# Patient Record
Sex: Male | Born: 1965 | Hispanic: Yes | Marital: Married | Smoking: Former smoker
Health system: Southern US, Community
[De-identification: ages and names within clinical notes are randomized; demographics above are authoritative.]

## PROBLEM LIST (undated history)

## (undated) DIAGNOSIS — I10 Essential (primary) hypertension: Secondary | ICD-10-CM

## (undated) DIAGNOSIS — I639 Cerebral infarction, unspecified: Secondary | ICD-10-CM

## (undated) HISTORY — DX: Essential (primary) hypertension: I10

## (undated) HISTORY — PX: NO PAST SURGERIES: SHX2092

---

## 2010-10-28 ENCOUNTER — Ambulatory Visit: Payer: Self-pay | Admitting: Internal Medicine

## 2015-07-22 ENCOUNTER — Ambulatory Visit
Admission: EM | Admit: 2015-07-22 | Discharge: 2015-07-22 | Disposition: A | Discharge status: Home / Self Care | Payer: Commercial Managed Care - PPO | Attending: Family Medicine | Admitting: Family Medicine

## 2015-07-22 DIAGNOSIS — L237 Allergic contact dermatitis due to plants, except food: Secondary | ICD-10-CM | POA: Diagnosis not present

## 2015-07-22 MED ORDER — LORATADINE 10 MG PO TABS: 10.0000 mg | ORAL_TABLET | Freq: Every day | ORAL | Status: DC

## 2015-07-22 MED ORDER — PREDNISONE 10 MG (21) PO TBPK: ORAL_TABLET | ORAL | Status: DC

## 2015-07-22 NOTE — ED Notes (Signed)
Pt puts the fence and has bushes think got poison ivy rash is on hand neck and chest onset 2 days itching.

## 2015-07-22 NOTE — Discharge Instructions (Signed)
Follow up this week for high blood pressure with PCP, Check 1-2 times & write down to take with you  Hiedra venenosa  (Poison Ivy) Luego de la exposicin previa a la planta. La erupcin suele aparecer 48 horas despus de la exposicin. Suelen ser bultos (ppulas) o ampollas (vesculas) en un patrn lineal. abrirse. Los ojos tambin podran hincharse. Las hinchazn es peor por la maana y mejora a medida que Magazine features editor. Deben tomarse todas las precauciones para prevenir una infeccin bacteriana (por grmenes) secundaria, que puede ocasionar cicatrices. Mantenga todas las reas abiertas secas, limpias y vendadas y cbralas con un ungento antibacteriano, en caso que lo necesite. Si no aparece una infeccin secundaria, esta dermatitis generalmente se [place] 2 o 3 semanas sin tratamiento. INSTRUCCIONES PARA EL CUIDADO DOMICILIARIO Lvese cuidadosamente con agua y jabn tan pronto como ocurra la exposicin al txico. Tiene alrededor de media hora para retirar la resina de la planta antes de que le cause el sarpullido. El lavado destruir rpidamente el aceite o antgeno que se encuentra sobre la piel y que podr causar el sarpullido. Lave enrgicamente debajo de las uas. Todo resto de resina seguir diseminando el sarpullido. No se frote la piel vigorosamente cuando lava la zona afectada. La dermatitis no se extender si retira todo el aceite de la planta que haya quedado en su cuerpo. Un sarpullido que se ha transformado en lesiones que supuran (llagas) no diseminar el sarpullido, a menos que no se haya lavado cuidadosamente. Tambin es importante lavar todas las prendas que [place]. Pueden tener alrgenos [employer]. El sarpullido volver, an varios das ms tarde. La mejor medida es evitar el contacto con la planta en el futuro. La hiedra venenosa puede reconocerse por el nmero de hojas, En general, la hiedra venenosa tiene tres hojas con ramas floridas en un tallo simple. Podr adquirir  difenhidramina que es un medicamento de venta [place], y Pharmacologist segn lo necesite para Barrister's clerk. No conduzca automviles si este medicamento le produce somnolencia. Consulte con el profesional que lo asiste acerca de los medicamentos que podr administrarle a los nios. SOLICITE ATENCIN MDICA SI:  Observa reas abiertas.  Enrojecimiento que se extiende ms all de la zona del sarpullido.  Una secrecin purulenta (similar al pus).  Aumento del dolor.  Desarrolla otros signos de infeccin (como fiebre).   Esta informacin no tiene Marine scientist el consejo del mdico. Asegrese de hacerle al mdico cualquier pregunta que tenga.   Document Released: 12/25/2004 Document Revised: 06/09/2011 Elsevier Interactive Patient Education [employer].

## 2015-07-22 NOTE — ED Provider Notes (Signed)
CSN: [id number]     Arrival date & time 07/22/15  [id number] History   First MD Initiated Contact with Patient 07/22/15 1049     Chief Complaint  Patient presents with  . Rash   HPI  Bohumil Kerkman is a pleasant 50 y.o. male who presents with a rash. He was working at his son's house around the fence trying to cut down bushes and was exposed to poison ivy 2 days ago. Developed swelling of his left eyelid and a scant rash on his chest and right arm. The rash is itchy and red. He has had palpable poison ivy several times before and this is very similar. Denies any shortness of breath.  Denies visual changes or eye discharge.  History reviewed. No pertinent past medical history. History reviewed. No pertinent past surgical history. History reviewed. No pertinent family history. Social History  Substance Use Topics  . Smoking status: Never Smoker   . Smokeless tobacco: None  . Alcohol Use: Yes    Review of Systems  Constitutional: Negative.   HENT: Negative.   Eyes: Negative.   Respiratory: Negative.   Cardiovascular: Negative.   Gastrointestinal: Negative.   Genitourinary: Negative.   Musculoskeletal: Negative.   Skin: Positive for rash.  Allergic/Immunologic: Positive for environmental allergies.  Neurological: Negative.   Hematological: Negative.   Psychiatric/Behavioral: Negative.     Allergies  Influenza a (h1n1) monoval vac  Home Medications   Prior to Admission medications   Medication Sig Start Date End Date Taking? Authorizing Provider  loratadine (CLARITIN) 10 MG tablet Take 1 tablet (10 mg total) by mouth daily. Take 1 tablet in the morning. As needed for itching. 07/22/15   Andria Meuse, NP  predniSONE (STERAPRED UNI-PAK 21 TAB) 10 MG (21) TBPK tablet 6 tabs day 1 and 2, 5 tabs day 3 and 4, 4 tabs day 5 and 6, 3 tabs day 7 and 8, 2 tabs day 9 and 10, 1 tab day 11 and 12. Take orally 07/22/15   Andria Meuse, NP   Meds Ordered and Administered this Visit  Medications  - No data to display  BP 173/110 mmHg  Pulse 61  Temp(Src) 97.9 F (36.6 C) (Oral)  Resp 16  SpO2 100% No data found.   Physical Exam  Constitutional: He is oriented to person, place, and time. He appears well-developed and well-nourished. No distress.  HENT:  Head: Normocephalic and atraumatic.  Eyes: Conjunctivae are normal. No scleral icterus.  Neck: Normal range of motion.  Cardiovascular: Normal rate and regular rhythm.   Pulmonary/Chest: Effort normal and breath sounds normal. No respiratory distress.  Abdominal: Soft. Bowel sounds are normal. He exhibits no distension.  Musculoskeletal: Normal range of motion. He exhibits no edema or tenderness.  Neurological: He is alert and oriented to person, place, and time. No cranial nerve deficit.  Skin: Skin is warm and dry. Rash noted. Rash is vesicular. There is erythema. No cyanosis. Nails show no clubbing.     Psychiatric: His behavior is normal. Judgment normal.  Nursing note and vitals reviewed.   ED Course  Procedures n/a  Labs Review Labs Reviewed - No data to display  Imaging Review No results found.  MDM   1. Contact dermatitis due to poison ivy   With periorbital edema, scant lesions to chest & right arm.   Plan: diagnosis reviewed with patient/daughter Rx as per orders;  benefits, risks, potential side effects reviewed  Recommend supportive treatment with oatmeal baths Seek additional medical  care if symptoms worsen or are not improving     Andria Meuse, NP 07/22/15 1117

## 2016-12-23 ENCOUNTER — Ambulatory Visit: Payer: Self-pay | Admitting: Family Medicine

## 2016-12-25 ENCOUNTER — Ambulatory Visit (INDEPENDENT_AMBULATORY_CARE_PROVIDER_SITE_OTHER): Payer: Commercial Managed Care - PPO | Admitting: Family Medicine

## 2016-12-25 ENCOUNTER — Encounter: Payer: Self-pay | Admitting: Family Medicine

## 2016-12-25 VITALS — BP 160/100 | HR 88 | Ht 66.0 in | Wt 199.0 lb

## 2016-12-25 DIAGNOSIS — Z7689 Persons encountering health services in other specified circumstances: Secondary | ICD-10-CM | POA: Diagnosis not present

## 2016-12-25 DIAGNOSIS — I10 Essential (primary) hypertension: Secondary | ICD-10-CM | POA: Diagnosis not present

## 2016-12-25 DIAGNOSIS — L409 Psoriasis, unspecified: Secondary | ICD-10-CM

## 2016-12-25 DIAGNOSIS — Z23 Encounter for immunization: Secondary | ICD-10-CM | POA: Diagnosis not present

## 2016-12-25 MED ORDER — TRIAMCINOLONE ACETONIDE 0.1 % EX LOTN: 1.0000 "application " | TOPICAL_LOTION | Freq: Two times a day (BID) | CUTANEOUS | 0 refills | Status: DC

## 2016-12-25 MED ORDER — AMLODIPINE BESYLATE 10 MG PO TABS: 10.0000 mg | ORAL_TABLET | Freq: Every day | ORAL | 3 refills | Status: DC

## 2016-12-25 MED ORDER — TRIAMCINOLONE ACETONIDE 0.025 % EX LOTN: 1.0000 "application " | TOPICAL_LOTION | Freq: Two times a day (BID) | CUTANEOUS | 1 refills | Status: DC

## 2016-12-25 NOTE — Progress Notes (Signed)
Name: Ian Moore   MRN: [id number]    DOB: 18-Jul-1965   Date:12/25/2016       Progress Note  Subjective  Chief Complaint  Chief Complaint  Patient presents with  . Establish Care    new to area- needs PCP    Patient presents for establishing care.   Hypertension  This is a chronic problem. The current episode started more than 1 month ago. The problem has been gradually worsening since onset. The problem is uncontrolled. Pertinent negatives include no blurred vision, chest pain, headaches, malaise/fatigue, neck pain, palpitations, peripheral edema or shortness of breath. There are no associated agents to hypertension. There are no known risk factors for coronary artery disease. Past treatments include nothing. The current treatment provides moderate improvement. There are no compliance problems.  There is no history of angina, kidney disease, CAD/MI, CVA, heart failure, left ventricular hypertrophy, PVD or retinopathy. There is no history of chronic renal disease, a hypertension causing med or renovascular disease.  Rash  This is a chronic problem. The current episode started more than 1 year ago. The problem has been gradually worsening since onset. The affected locations include the scalp. He was exposed to nothing. Pertinent negatives include no cough, diarrhea, fever, joint pain, shortness of breath or sore throat.    No problem-specific Assessment & Plan notes found for this encounter.   Past Medical History:  Diagnosis Date  . Hypertension     History reviewed. No pertinent surgical history.  History reviewed. No pertinent family history.  Social History   Social History  . Marital status: Married    Spouse name: N/A  . Number of children: N/A  . Years of education: N/A   Occupational History  . Not on file.   Social History Main Topics  . Smoking status: Never Smoker  . Smokeless tobacco: Never Used  . Alcohol use Yes  . Drug use: No  . Sexual activity: Not  on file   Other Topics Concern  . Not on file   Social History Narrative  . No narrative on file    Allergies  Allergen Reactions  . Penicillins   . Influenza A (H1n1) Monoval Vac Rash    Outpatient Medications Prior to Visit  Medication Sig Dispense Refill  . loratadine (CLARITIN) 10 MG tablet Take 1 tablet (10 mg total) by mouth daily. Take 1 tablet in the morning. As needed for itching. 30 tablet 0  . predniSONE (STERAPRED UNI-PAK 21 TAB) 10 MG (21) TBPK tablet 6 tabs day 1 and 2, 5 tabs day 3 and 4, 4 tabs day 5 and 6, 3 tabs day 7 and 8, 2 tabs day 9 and 10, 1 tab day 11 and 12. Take orally 42 tablet 0   No facility-administered medications prior to visit.     Review of Systems  Constitutional: Negative for chills, fever, malaise/fatigue and weight loss.  HENT: Negative for ear discharge, ear pain and sore throat.   Eyes: Negative for blurred vision.  Respiratory: Negative for cough, sputum production, shortness of breath and wheezing.   Cardiovascular: Negative for chest pain, palpitations and leg swelling.  Gastrointestinal: Negative for abdominal pain, blood in stool, constipation, diarrhea, heartburn, melena and nausea.  Genitourinary: Negative for dysuria, frequency, hematuria and urgency.  Musculoskeletal: Negative for back pain, joint pain, myalgias and neck pain.  Skin: Negative for rash.  Neurological: Negative for dizziness, tingling, sensory change, focal weakness and headaches.  Endo/Heme/Allergies: Negative for environmental allergies and polydipsia. Does  not bruise/bleed easily.  Psychiatric/Behavioral: Negative for depression and suicidal ideas. The patient is not nervous/anxious and does not have insomnia.      Objective  Vitals:   12/25/16 1458  BP: (!) 160/100  Pulse: 88  Weight: 199 lb (90.3 kg)  Height: 5\' 6"  (1.676 m)    Physical Exam  Constitutional: He is oriented to person, place, and time and well-developed, well-nourished, and in no  distress.  HENT:  Head: Normocephalic.  Right Ear: External ear normal.  Left Ear: External ear normal.  Nose: Nose normal.  Mouth/Throat: Oropharynx is clear and moist.  Eyes: Pupils are equal, round, and reactive to light. Conjunctivae and EOM are normal. Right eye exhibits no discharge. Left eye exhibits no discharge. No scleral icterus.  Neck: Normal range of motion. Neck supple. No JVD present. No tracheal deviation present. No thyromegaly present.  Cardiovascular: Normal rate, regular rhythm, normal heart sounds and intact distal pulses.  Exam reveals no gallop and no friction rub.   No murmur heard. Pulmonary/Chest: Breath sounds normal. No respiratory distress. He has no wheezes. He has no rales.  Abdominal: Soft. Bowel sounds are normal. He exhibits no mass. There is no hepatosplenomegaly. There is no tenderness. There is no rebound, no guarding and no CVA tenderness.  Musculoskeletal: Normal range of motion. He exhibits no edema or tenderness.  Lymphadenopathy:    He has no cervical adenopathy.  Neurological: He is alert and oriented to person, place, and time. He has normal sensation, normal strength, normal reflexes and intact cranial nerves. No cranial nerve deficit.  Skin: Skin is warm. No rash noted.  Psychiatric: Mood and affect normal.  Nursing note and vitals reviewed.     Assessment & Plan  Problem List Items Addressed This Visit    None    Visit Diagnoses    Establishing care with new doctor, encounter for    -  Primary   Essential hypertension       Relevant Medications   amLODipine (NORVASC) 10 MG tablet   Psoriasis       Relevant Medications   Triamcinolone Acetonide 0.025 % LOTN   Other Relevant Orders   Ambulatory referral to Dermatology   Need for prophylactic vaccination with tetanus-diphtheria (Td)       Relevant Orders   Td : Tetanus/diphtheria >7yo Preservative  free (Completed)      Meds ordered this encounter  Medications  . amLODipine  (NORVASC) 10 MG tablet    Sig: Take 1 tablet (10 mg total) by mouth daily.    Dispense:  30 tablet    Refill:  3  . DISCONTD: triamcinolone lotion (KENALOG) 0.1 %    Sig: Apply 1 application topically 2 (two) times daily. Apply to scalp    Dispense:  60 mL    Refill:  0  . Triamcinolone Acetonide 0.025 % LOTN    Sig: Apply 1 application topically 2 (two) times daily.    Dispense:  1 Bottle    Refill:  1      Dr. Otilio Miu [facility]  12/25/16

## 2016-12-25 NOTE — Patient Instructions (Signed)
Psoriasis (Psoriasis) La psoriasis es una afeccin prolongada (crnica) de inflamacin en la piel. Se produce debido a que el sistema inmunitario hace que se formen clulas cutneas con mucha rapidez. En consecuencia, se desarrollan demasiadas clulas cutneas que forman manchas (placas) rojas y con relieve en la piel, que se ven plateadas. Las placas pueden aparecer en cualquier parte del cuerpo. Pueden tener cualquier tamao o forma. La psoriasis puede ir y venir. Puede ser de leve a muy grave. No puede transmitirse de [place] persona a otra (no es contagiosa). CAUSAS Se desconoce la causa de la psoriasis, pero determinados factores pueden empeorar la afeccin. Estos incluyen los siguientes:  Lesiones o traumatismos en la piel, como cortes, raspones, quemaduras de sol y sequedad.  Falta de sol.  Algunos medicamentos.  Alcohol.  Consumo de tabaco.  El estrs.  Infecciones causadas por bacterias o virus. FACTORES DE RIESGO Es ms probable que esta afeccin se manifieste en:  Las personas con antecedentes familiares de psoriasis.  Las Engineer, manufacturing de origen caucsico.  Las personas que tienen de 5 a 63aos y de 31 a 75aos. SNTOMAS Existen cinco tipos diferentes de psoriasis. Se puede presentar ms de un tipo de psoriasis a lo largo de la vida. Estos tipos son los siguientes:  En placas.  En gotas.  Seborreica.  Pustulosa.  Eritrodrmica. Cada tipo de psoriasis presenta sntomas diferentes.  Uno de los sntomas de la psoriasis en placas es la presencia de [place] rojas y con relieve, con una cubierta blanca plateada (escama). Estas placas pueden causar picazn. Las uas pueden estar frgiles y [place], o caerse.  Uno de los sntomas de la psoriasis en gotas es la presencia de manchas rojas pequeas que a menudo aparecen en el tronco, los brazos y las piernas. Estas manchas pueden desarrollarse despus de una enfermedad, en especial, faringitis estreptoccica.  Uno de los  sntomas de la psoriasis seborreica es la presencia de placas debajo de las axilas o los senos, o en los genitales, la ingle o las nalgas.  Uno de los sntomas de la psoriasis pustulosa es la presencia de los bultos llenos de pus (dolorosos, rojos e hinchados) en las [place] plantas de los pies. Tambin puede sentirse exhausto, afiebrado, dbil o sin apetito.  Uno de los sntomas de la psoriasis eritrodrmica es el aspecto rojo brillante en la piel, que puede parecer [place]. Puede sentir los latidos cardacos acelerados y Optometrist corporal muy alta o muy baja. Quizs sienta picazn o dolor. DIAGNSTICO El mdico puede sospechar la presencia de psoriasis en funcin de los sntomas y los antecedentes familiares. El mdico Environmental education officer un examen fsico. Esto puede incluir un procedimiento para extraer y examinar Truddie Coco de tejido (biopsia). Tambin pueden derivarlo a un mdico especialista en enfermedades de la piel (dermatlogo). TRATAMIENTO Esta afeccin no tiene [place], pero el tratamiento puede ayudar a Therapist, sports. Entre los [employer] del [place], se incluye lo siguiente:  Ayudar a que la piel cicatrice.  Reducir la picazn y la inflamacin.  Retrasar el desarrollo de nuevas clulas cutneas.  Ayudar a que el sistema inmunitario responda mejor y no deteriore la piel. El tratamiento vara, segn la gravedad de la afeccin. El tratamiento puede incluir lo siguiente:  Cremas o ungentos.  Exposicin a rayos ultravioleta (fototerapia). Esto puede incluir la luz natural del sol o la fototerapia en el consultorio mdico.  Medicamentos (tratamiento sistmico). Estos medicamentos pueden ayudar a que el cuerpo controle mejor el desarrollo de las  clulas cutneas y la inflamacin. Pueden utilizarse junto con fototerapia o ungentos. Si tiene una infeccin, tambin puede recibir antibiticos. INSTRUCCIONES PARA EL CUIDADO EN EL HOGAR Cuidado de la  piel  Humctese la piel segn sea necesario. Use solamente las cremas humectantes aprobadas por su mdico.  Aplique compresas fras en las zonas afectadas.  No se rasque la piel. Estilo de vida  No consuma productos que contengan tabaco. Estos incluyen cigarrillos, tabaco para mascar y Psychologist, sport and exercise. Si necesita ayuda para dejar de fumar, consulte al mdico.  No tome alcohol, o tome poca cantidad.  Pruebe con tcnicas que reduzcan el estrs, como meditacin o yoga.  Expngase al sol como se lo haya indicado el mdico. No se broncee.  Considere participar en un grupo de apoyo para la psoriasis. Medicamentos  Tome o use los medicamentos de venta libre y los recetados solamente como se lo haya indicado el mdico.  Si le recetaron un antibitico, tmelo o selo como se lo haya indicado el mdico. No deje de tomar el antibitico aunque la afeccin mejore. Instrucciones generales  Lleve un diario como ayuda para rastrear lo que le desencadena un brote. Intente evitar los factores desencadenantes.  Consulte a un consejero o un asistente social si se siente triste, frustrado o desesperanzado con respecto a su afeccin ya que considera que esta interfiere en su trabajo y sus relaciones.  Concurra a todas las visitas de control como se lo haya indicado el mdico. Esto es importante. SOLICITE ATENCIN MDICA SI:  El dolor empeora.  Aumentan el enrojecimiento y Nurse, children's en la zona de la herida.  Comienza a Patent attorney o entumecimiento en las articulaciones, o estos sntomas empeoran.  Las uas comienzan a romperse fcilmente o a desprenderse del lecho ungueal.  Tiene fiebre.  Se siente deprimido. Esta informacin no tiene Marine scientist el consejo del mdico. Asegrese de hacerle al mdico cualquier pregunta que tenga. Document Released: 12/25/2004 Document Revised: 12/06/2014 Document Reviewed: 08/02/2014 Elsevier Interactive Patient Education  2018 [employer].

## 2017-01-12 ENCOUNTER — Encounter: Payer: Self-pay | Admitting: Family Medicine

## 2017-01-12 ENCOUNTER — Ambulatory Visit (INDEPENDENT_AMBULATORY_CARE_PROVIDER_SITE_OTHER): Payer: Commercial Managed Care - PPO | Admitting: Family Medicine

## 2017-01-12 VITALS — BP 120/80 | HR 64 | Ht 66.0 in | Wt 198.0 lb

## 2017-01-12 DIAGNOSIS — E669 Obesity, unspecified: Secondary | ICD-10-CM | POA: Diagnosis not present

## 2017-01-12 DIAGNOSIS — I10 Essential (primary) hypertension: Secondary | ICD-10-CM

## 2017-01-12 DIAGNOSIS — Z1211 Encounter for screening for malignant neoplasm of colon: Secondary | ICD-10-CM

## 2017-01-12 DIAGNOSIS — Z0000 Encounter for general adult medical examination without abnormal findings: Secondary | ICD-10-CM | POA: Diagnosis not present

## 2017-01-12 DIAGNOSIS — Z114 Encounter for screening for human immunodeficiency virus [HIV]: Secondary | ICD-10-CM

## 2017-01-12 MED ORDER — AMLODIPINE BESYLATE 10 MG PO TABS: 10.0000 mg | ORAL_TABLET | Freq: Every day | ORAL | 1 refills | Status: DC

## 2017-01-12 NOTE — Progress Notes (Signed)
Name: Ian Moore   MRN: [id number]    DOB: [birth year]   Date:01/12/2017       Progress Note  Subjective  Chief Complaint  Chief Complaint  Patient presents with  . Annual Exam    needs colonoscopy    Patient presents for annual physical exam.     No problem-specific Assessment & Plan notes found for this encounter.   Past Medical History:  Diagnosis Date  . Hypertension     No past surgical history on file.  No family history on file.  Social History   Social History  . Marital status: Married    Spouse name: N/A  . Number of children: N/A  . Years of education: N/A   Occupational History  . Not on file.   Social History Main Topics  . Smoking status: Never Smoker  . Smokeless tobacco: Never Used  . Alcohol use Yes  . Drug use: No  . Sexual activity: Not on file   Other Topics Concern  . Not on file   Social History Narrative  . No narrative on file    Allergies  Allergen Reactions  . Penicillins   . Influenza A (H1n1) Monoval Vac Rash    Outpatient Medications Prior to Visit  Medication Sig Dispense Refill  . Triamcinolone Acetonide 0.025 % LOTN Apply 1 application topically 2 (two) times daily. 1 Bottle 1  . amLODipine (NORVASC) 10 MG tablet Take 1 tablet (10 mg total) by mouth daily. 30 tablet 3   No facility-administered medications prior to visit.     Review of Systems  Constitutional: Negative for chills, fever, malaise/fatigue and weight loss.  HENT: Negative for ear discharge, ear pain and sore throat.   Eyes: Negative for blurred vision.  Respiratory: Negative for cough, sputum production, shortness of breath and wheezing.   Cardiovascular: Negative for chest pain, palpitations and leg swelling.  Gastrointestinal: Negative for abdominal pain, blood in stool, constipation, diarrhea, heartburn, melena and nausea.  Genitourinary: Negative for dysuria, frequency, hematuria and urgency.  Musculoskeletal: Negative for back pain, joint  pain, myalgias and neck pain.  Skin: Positive for itching. Negative for rash.       t dap  Neurological: Negative for dizziness, tingling, sensory change, focal weakness and headaches.  Endo/Heme/Allergies: Negative for environmental allergies and polydipsia. Does not bruise/bleed easily.  Psychiatric/Behavioral: Negative for depression and suicidal ideas. The patient is not nervous/anxious and does not have insomnia.      Objective  Vitals:   01/12/17 0849  BP: 120/80  Pulse: 64  Weight: 198 lb (89.8 kg)  Height: 5\' 6"  (1.676 m)    Physical Exam  Constitutional: He is oriented to person, place, and time and well-developed, well-nourished, and in no distress. Vital signs are normal.  HENT:  Head: Normocephalic.  Right Ear: Hearing, tympanic membrane, external ear and ear canal normal.  Left Ear: Hearing, tympanic membrane, external ear and ear canal normal.  Nose: Nose normal. No mucosal edema.  Mouth/Throat: Uvula is midline, oropharynx is clear and moist and mucous membranes are normal. No oropharyngeal exudate, posterior oropharyngeal edema or posterior oropharyngeal erythema.  Eyes: Right eye visual fields normal and left eye visual fields normal. Pupils are equal, round, and reactive to light. Conjunctivae, EOM and lids are normal. Right eye exhibits no discharge. Left eye exhibits no discharge. No scleral icterus.  Fundoscopic exam:      The right eye shows no arteriolar narrowing and no AV nicking.  The left eye shows no arteriolar narrowing and no AV nicking.  Neck: Trachea normal and normal range of motion. Neck supple. Normal carotid pulses, no hepatojugular reflux and no JVD present. Carotid bruit is not present. No tracheal deviation present. No thyromegaly present.  Cardiovascular: Normal rate, regular rhythm, S1 normal, S2 normal, normal heart sounds, intact distal pulses and normal pulses.  PMI is not displaced.  Exam reveals no gallop, no S3, no S4, no friction  rub and no decreased pulses.   No murmur heard. Pulmonary/Chest: Breath sounds normal. No respiratory distress. He has no wheezes. He has no rales. Right breast exhibits no mass. Left breast exhibits no mass.  Abdominal: Soft. Normal aorta and bowel sounds are normal. He exhibits no mass. There is no hepatosplenomegaly. There is no tenderness. There is no rebound, no guarding and no CVA tenderness.  Genitourinary: Rectum normal, prostate normal and testes/scrotum normal. Rectal exam shows guaiac negative stool.  Musculoskeletal: Normal range of motion. He exhibits no edema or tenderness.       Lumbar back: Normal.  Lymphadenopathy:       Head (right side): No submandibular adenopathy present.       Head (left side): No submandibular adenopathy present.    He has no cervical adenopathy.    He has no axillary adenopathy.  Neurological: He is alert and oriented to person, place, and time. He has normal sensation, normal strength, normal reflexes and intact cranial nerves. No cranial nerve deficit.  Skin: Skin is warm and intact. No rash noted.  Psychiatric: Mood and affect normal.  Nursing note and vitals reviewed.     Assessment & Plan  Problem List Items Addressed This Visit    None    Visit Diagnoses    Annual physical exam    -  Primary   Obesity (BMI 30.0-34.9)       Relevant Orders   Renal Function Panel   Lipid Profile   Essential hypertension       Relevant Medications   amLODipine (NORVASC) 10 MG tablet   Other Relevant Orders   Renal Function Panel   Colon cancer screening       Relevant Orders   Ambulatory referral to Gastroenterology   Encounter for screening for HIV       Relevant Orders   HIV antibody      Meds ordered this encounter  Medications  . amLODipine (NORVASC) 10 MG tablet    Sig: Take 1 tablet (10 mg total) by mouth daily.    Dispense:  90 tablet    Refill:  1      Dr. Otilio Miu [facility]  01/12/17

## 2017-01-13 LAB — RENAL FUNCTION PANEL
ALBUMIN: 4.6 g/dL (ref 3.5–5.5)
BUN/Creatinine Ratio: 18 (ref 9–20)
BUN: 17 mg/dL (ref 6–24)
CALCIUM: 9.5 mg/dL (ref 8.7–10.2)
CO2: 24 mmol/L (ref 20–29)
CREATININE: 0.94 mg/dL (ref 0.76–1.27)
Chloride: 102 mmol/L (ref 96–106)
GFR calc Af Amer: 107 mL/min/{1.73_m2} (ref >59)
GFR, EST NON AFRICAN AMERICAN: 93 mL/min/{1.73_m2} (ref >59)
Glucose: 118 mg/dL — ABNORMAL HIGH (ref 65–99)
PHOSPHORUS: 3.8 mg/dL (ref 2.5–4.5)
Potassium: 4 mmol/L (ref 3.5–5.2)
SODIUM: 143 mmol/L (ref 134–144)

## 2017-01-13 LAB — LIPID PANEL
CHOL/HDL RATIO: 3.8 ratio (ref 0.0–5.0)
CHOLESTEROL TOTAL: 154 mg/dL (ref 100–199)
HDL: 41 mg/dL (ref >39)
LDL Calculated: 91 mg/dL (ref 0–99)
TRIGLYCERIDES: 111 mg/dL (ref 0–149)
VLDL Cholesterol Cal: 22 mg/dL (ref 5–40)

## 2017-01-13 LAB — HIV ANTIBODY (ROUTINE TESTING W REFLEX): HIV Screen 4th Generation wRfx: NONREACTIVE

## 2017-01-21 ENCOUNTER — Emergency Department: Payer: Commercial Managed Care - PPO

## 2017-01-21 ENCOUNTER — Emergency Department
Admission: EM | Admit: 2017-01-21 | Discharge: 2017-01-21 | Disposition: A | Discharge status: Home / Self Care | Payer: Commercial Managed Care - PPO | Attending: Emergency Medicine | Admitting: Emergency Medicine

## 2017-01-21 ENCOUNTER — Ambulatory Visit
Admission: EM | Admit: 2017-01-21 | Discharge: 2017-01-21 | Disposition: A | Discharge status: Other Acute Inpatient Hospital | Payer: Commercial Managed Care - PPO | Attending: Family Medicine | Admitting: Family Medicine

## 2017-01-21 ENCOUNTER — Encounter: Payer: Self-pay | Admitting: Emergency Medicine

## 2017-01-21 ENCOUNTER — Encounter: Payer: Self-pay | Admitting: *Deleted

## 2017-01-21 DIAGNOSIS — I10 Essential (primary) hypertension: Secondary | ICD-10-CM | POA: Insufficient documentation

## 2017-01-21 DIAGNOSIS — H5712 Ocular pain, left eye: Secondary | ICD-10-CM | POA: Diagnosis not present

## 2017-01-21 DIAGNOSIS — H04002 Unspecified dacryoadenitis, left lacrimal gland: Secondary | ICD-10-CM | POA: Insufficient documentation

## 2017-01-21 DIAGNOSIS — Z87891 Personal history of nicotine dependence: Secondary | ICD-10-CM | POA: Diagnosis not present

## 2017-01-21 DIAGNOSIS — L03213 Periorbital cellulitis: Secondary | ICD-10-CM | POA: Insufficient documentation

## 2017-01-21 DIAGNOSIS — L03211 Cellulitis of face: Secondary | ICD-10-CM

## 2017-01-21 DIAGNOSIS — H05012 Cellulitis of left orbit: Secondary | ICD-10-CM

## 2017-01-21 LAB — CBC WITH DIFFERENTIAL/PLATELET
BASOS ABS: 0.1 10*3/uL (ref 0–0.1)
Basophils Relative: 1 %
EOS ABS: 0.1 10*3/uL (ref 0–0.7)
EOS PCT: 1 %
HCT: 44.8 % (ref 40.0–52.0)
Hemoglobin: 15.4 g/dL (ref 13.0–18.0)
LYMPHS PCT: 17 %
Lymphs Abs: 1.9 10*3/uL (ref 1.0–3.6)
MCH: 29 pg (ref 26.0–34.0)
MCHC: 34.4 g/dL (ref 32.0–36.0)
MCV: 84.3 fL (ref 80.0–100.0)
MONO ABS: 0.7 10*3/uL (ref 0.2–1.0)
Monocytes Relative: 6 %
Neutro Abs: 8.5 10*3/uL — ABNORMAL HIGH (ref 1.4–6.5)
Neutrophils Relative %: 75 %
PLATELETS: 232 10*3/uL (ref 150–440)
RBC: 5.31 MIL/uL (ref 4.40–5.90)
RDW: 12.6 % (ref 11.5–14.5)
WBC: 11.3 10*3/uL — ABNORMAL HIGH (ref 3.8–10.6)

## 2017-01-21 LAB — BASIC METABOLIC PANEL
ANION GAP: 9 (ref 5–15)
BUN: 14 mg/dL (ref 6–20)
CALCIUM: 8.8 mg/dL — AB (ref 8.9–10.3)
CO2: 25 mmol/L (ref 22–32)
Chloride: 102 mmol/L (ref 101–111)
Creatinine, Ser: 0.93 mg/dL (ref 0.61–1.24)
GFR calc Af Amer: >60 mL/min (ref >60)
Glucose, Bld: 107 mg/dL — ABNORMAL HIGH (ref 65–99)
Potassium: 3.5 mmol/L (ref 3.5–5.1)
Sodium: 136 mmol/L (ref 135–145)

## 2017-01-21 IMAGING — CT CT ORBITS W/ CM
3 series · 14 of 47 positions shown, 16 images · IV contrast (iopamidol)
Comparison: None.

CLINICAL DATA: Left eye swelling that started on [REDACTED] and became
worse today.

EXAM:
CT ORBITS WITH CONTRAST
TECHNIQUE: Multidetector CT images was performed according to the standard
protocol following intravenous contrast administration.
CONTRAST:  75mL [WI] IOPAMIDOL ([WI]) INJECTION 61%

[Series 3: orbits 2.0 h30s st · axial · 0.25mm/px · z∈[+418,+504]mm · 8 of 51 slices shown, 10 images]
[im 4/51  brain]
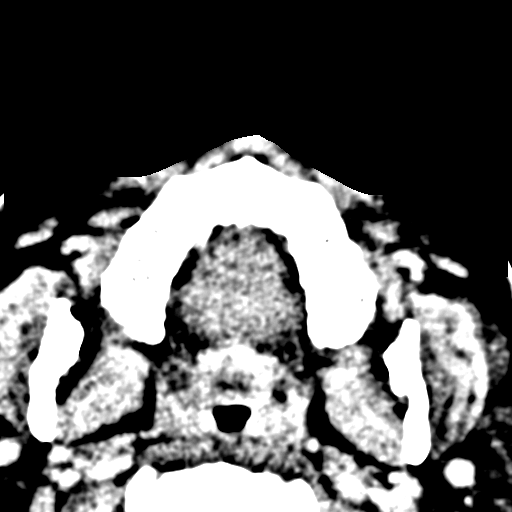
[im 4/51  bone]
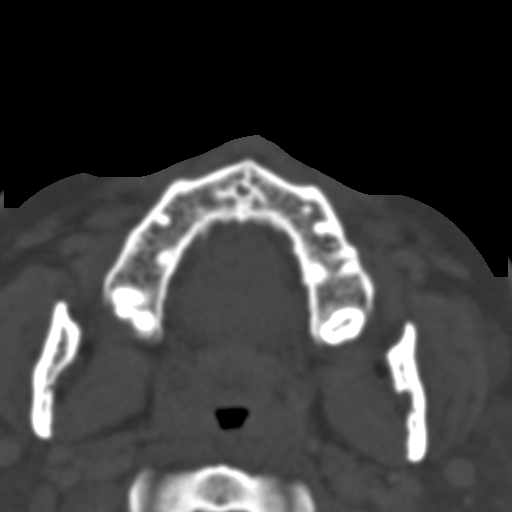
[im 11/51  bone]
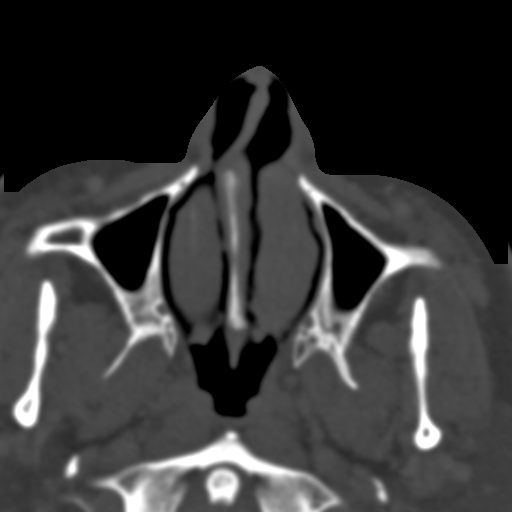
[im 16/51  bone]
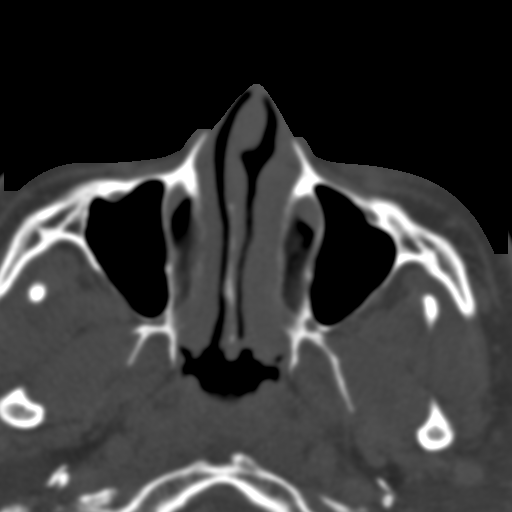
[im 23/51  bone]
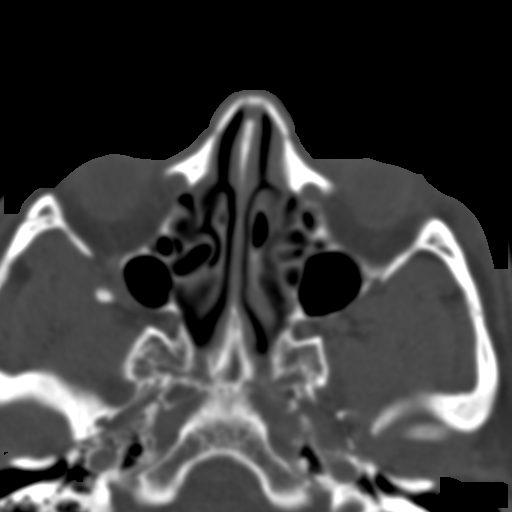
[im 28/51  brain]
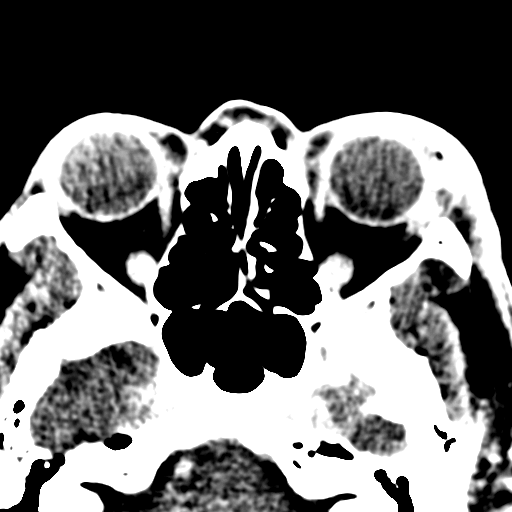
[im 28/51  bone]
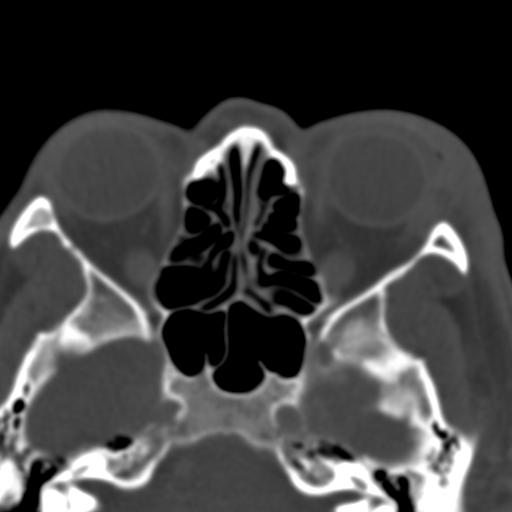
[im 35/51  bone]
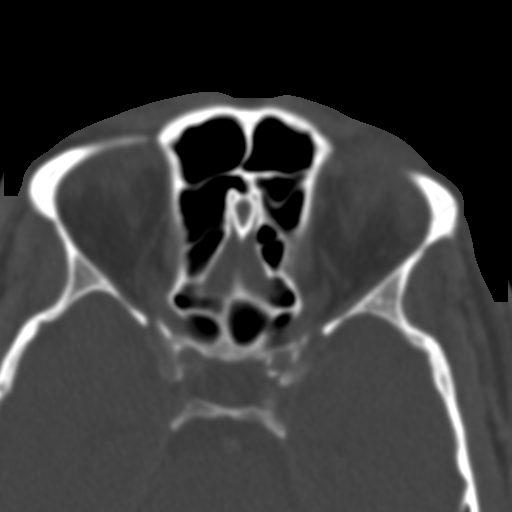
[im 40/51  bone]
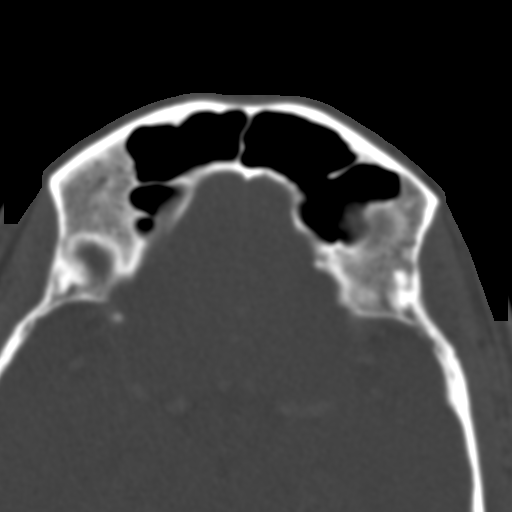
[im 47/51  bone]
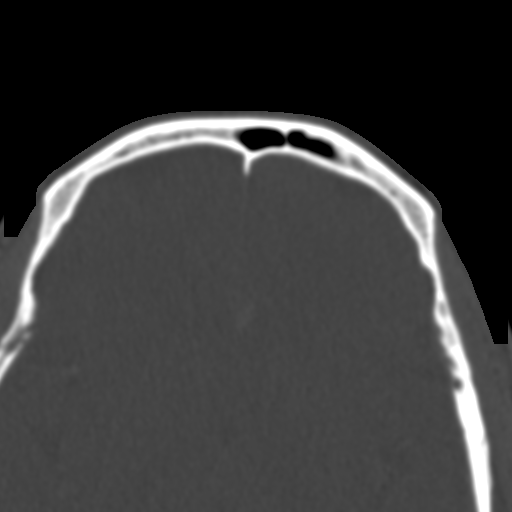

[Series 8: orbits 2.0 coronal · coronal · 0.23mm/px · 3 of 66 slices shown]
[im 22/66  bone]
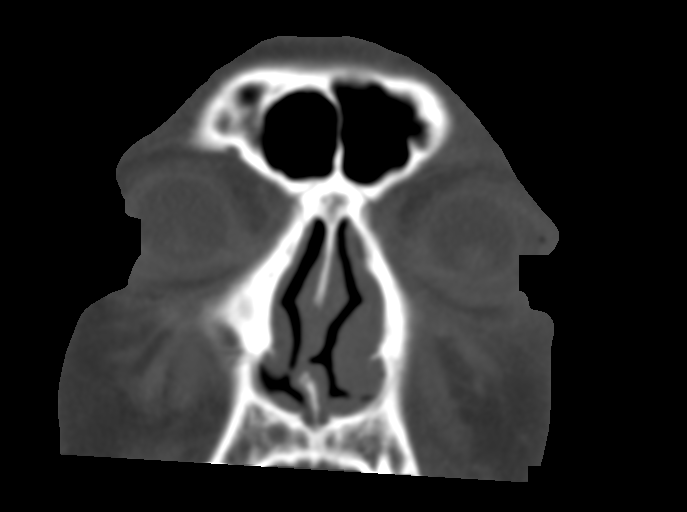
[im 29/66  bone]
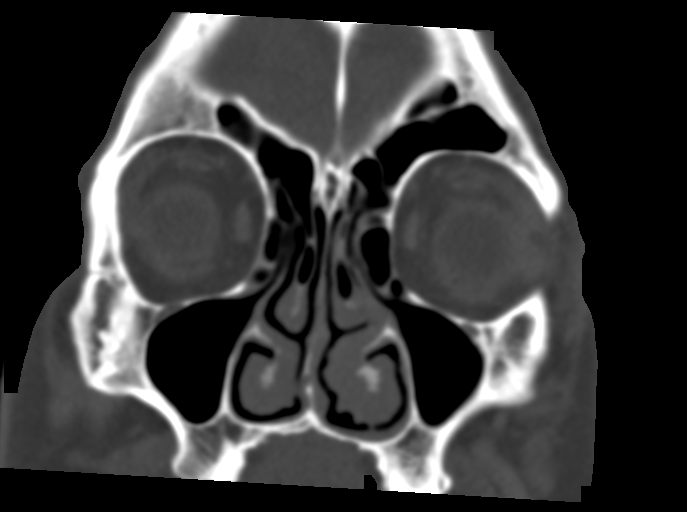
[im 37/66  bone]
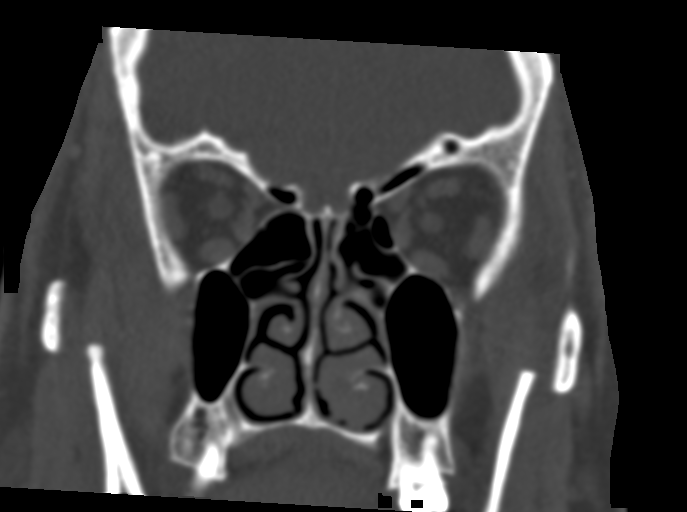

[Series 9: orbits 2.0 sagittal · sagittal · 0.22mm/px · 3 of 83 slices shown]
[im 28/83  bone]
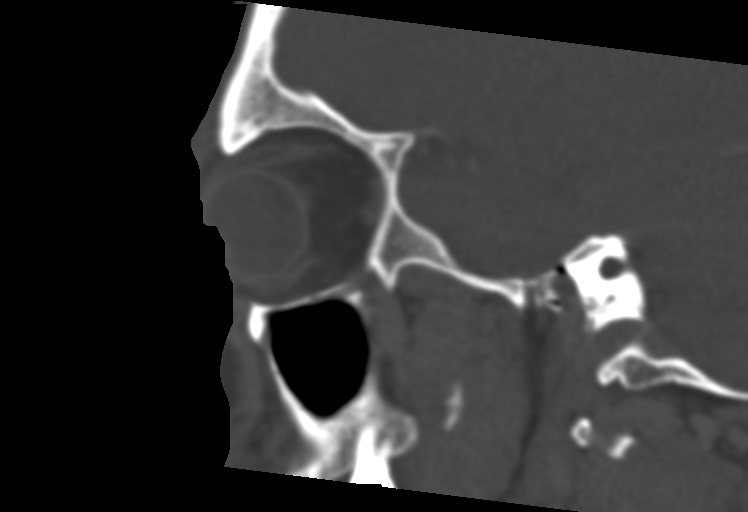
[im 42/83  bone]
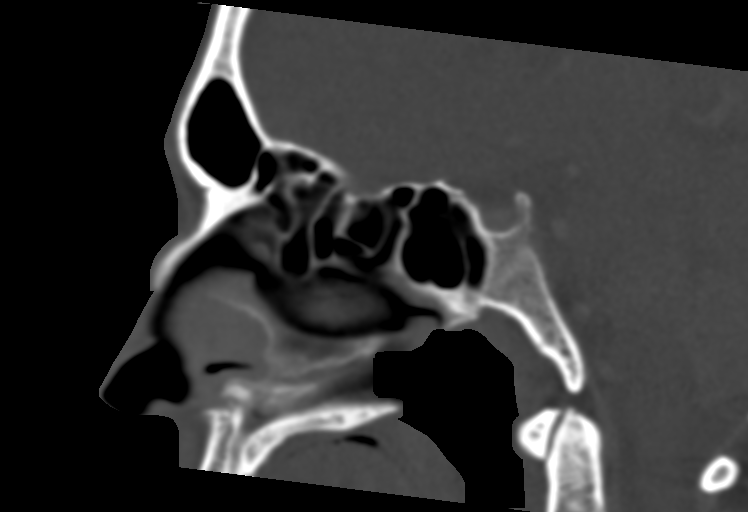
[im 55/83  bone]
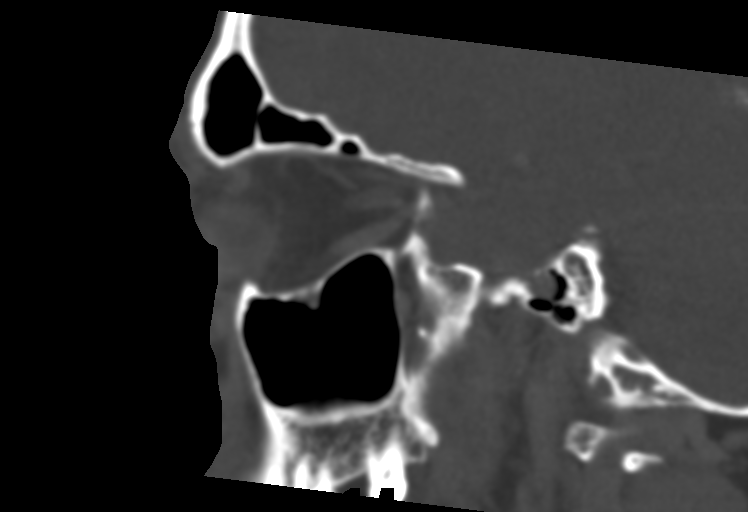

[14 of 47 positions shown; findings below may reference images not displayed]

FINDINGS: Orbits: Left preseptal soft tissue swelling with crescentic
low-density that likely reflects chemosis. The left lacrimal gland
is larger than the right and there is a 5 mm cystic structure in the
central gland with small fingerlike extension more posteriorly. The
globes, optic nerve sheath complexes, extraocular muscles, and
vessels have a symmetric normal appearance.

Visualized sinuses: Clear.

Soft tissues: Periorbital soft tissue inflammation on the left as
above. No soft tissue gas or opaque foreign body.

Limited intracranial: Negative
IMPRESSION: Left dacryoadenitis with 5 mm collection. Associated preseptal
cellulitis.

## 2017-01-21 MED ORDER — METHYLPREDNISOLONE SODIUM SUCC 125 MG IJ SOLR
125.0000 mg | Freq: Once | INTRAMUSCULAR | Status: AC
  Administered 2017-01-21: 125 mg via INTRAVENOUS
  Filled 2017-01-21: qty 2

## 2017-01-21 MED ORDER — OXYCODONE-ACETAMINOPHEN 5-325 MG PO TABS: 1.0000 | ORAL_TABLET | Freq: Four times a day (QID) | ORAL | 0 refills | Status: DC | PRN

## 2017-01-21 MED ORDER — PREDNISONE 10 MG (21) PO TBPK: ORAL_TABLET | Freq: Every day | ORAL | 0 refills | Status: DC

## 2017-01-21 MED ORDER — CLINDAMYCIN HCL 300 MG PO CAPS: 300.0000 mg | ORAL_CAPSULE | Freq: Three times a day (TID) | ORAL | 0 refills | Status: DC

## 2017-01-21 MED ORDER — IOPAMIDOL (ISOVUE-300) INJECTION 61%
75.0000 mL | Freq: Once | INTRAVENOUS | Status: AC | PRN
  Administered 2017-01-21: 75 mL via INTRAVENOUS

## 2017-01-21 MED ORDER — MORPHINE SULFATE (PF) 4 MG/ML IV SOLN
4.0000 mg | Freq: Once | INTRAVENOUS | Status: AC
  Administered 2017-01-21: 4 mg via INTRAVENOUS
  Filled 2017-01-21: qty 1

## 2017-01-21 MED ORDER — CLINDAMYCIN PHOSPHATE 600 MG/50ML IV SOLN
600.0000 mg | Freq: Once | INTRAVENOUS | Status: AC
  Administered 2017-01-21: 600 mg via INTRAVENOUS
  Filled 2017-01-21: qty 50

## 2017-01-21 MED ORDER — DIPHENHYDRAMINE HCL 50 MG/ML IJ SOLN
25.0000 mg | Freq: Once | INTRAMUSCULAR | Status: AC
  Administered 2017-01-21: 25 mg via INTRAVENOUS
  Filled 2017-01-21: qty 1

## 2017-01-21 MED ORDER — OLOPATADINE HCL 0.1 % OP SOLN
1.0000 [drp] | Freq: Two times a day (BID) | OPHTHALMIC | Status: DC
  Filled 2017-01-21: qty 5

## 2017-01-21 MED ORDER — SODIUM CHLORIDE 0.9 % IV BOLUS (SEPSIS)
1000.0000 mL | Freq: Once | INTRAVENOUS | Status: AC
  Administered 2017-01-21: 1000 mL via INTRAVENOUS

## 2017-01-21 NOTE — ED Notes (Signed)
Pt from [facility] urgent care for swelling and pain to l eye. Doctor that called for report concerned with cellulitis.

## 2017-01-21 NOTE — ED Provider Notes (Signed)
[facility] Emergency Department Provider Note       Time seen: ----------------------------------------- 1:33 PM on 01/21/2017 -----------------------------------------     I have reviewed the triage vital signs and the nursing notes.   HISTORY   Chief Complaint Facial Swelling    HPI Cordae Mccarey is a 51 y.o. male with a history of hypertension who presents to the ED for left eye swelling started Friday and became worse today. Patient's eye was noted to be red and swollen and he has pain when moving the eye. He was sent from urgent care for possible CT imaging. Patient denies any difficulty with his vision just states his difficult to see because of swollen. He denies fevers, chills or other complaints. Patient states it started as a small area that was itching laterally around the eye.  Past Medical History:  Diagnosis Date  . Hypertension     There are no active problems to display for this patient.   History reviewed. No pertinent surgical history.  Allergies Penicillins and Influenza a (h1n1) monoval vac  Social History Social History  Substance Use Topics  . Smoking status: Former Research scientist (life sciences)  . Smokeless tobacco: Never Used  . Alcohol use Yes   Review of Systems Constitutional: Negative for fever. Eyes: Negative for vision changes. Positive for swelling in and around the left eye with itching ENT:  Negative for congestion, sore throat Cardiovascular: Negative for chest pain. Respiratory: Negative for shortness of breath. Musculoskeletal: Negative for back pain. Skin: Positive for redness and swelling of the left eyelid Neurological: Negative for headaches, focal weakness or numbness.  All systems negative/normal/unremarkable except as stated in the HPI  ____________________________________________   PHYSICAL EXAM:  VITAL SIGNS: ED Triage Vitals  Enc Vitals Group     BP 01/21/17 1147 (!) 169/106     Pulse Rate 01/21/17 1147  63     Resp 01/21/17 1147 16     Temp 01/21/17 1147 98.7 F (37.1 C)     Temp Source 01/21/17 1147 Oral     SpO2 01/21/17 1147 100 %     Weight 01/21/17 1148 195 lb (88.5 kg)     Height 01/21/17 1148 5\' 5"  (1.651 m)     Head Circumference --      Peak Flow --      Pain Score 01/21/17 1150 10     Pain Loc --      Pain Edu? --      Excl. in [place]? --     Constitutional: Alert and oriented. Well appearing and in no distress. Eyes: Extensive chemosis of the left eye, mild conjunctival injection. Lid edema is worse superiorly. There is mild periorbital edema. No significant pain with range of motion of the eye ENT   Head: Normocephalic and atraumatic.   Nose: No congestion/rhinnorhea.   Mouth/Throat: Mucous membranes are moist.   Neck: No stridor. Cardiovascular: Normal rate, regular rhythm. No murmurs, rubs, or gallops. Respiratory: Normal respiratory effort without tachypnea nor retractions. Breath sounds are clear and equal bilaterally. No wheezes/rales/rhonchi. Gastrointestinal: Soft and nontender. Normal bowel sounds Musculoskeletal: Nontender with normal range of motion in extremities. No lower extremity tenderness nor edema. Neurologic:  Normal speech and language. No gross focal neurologic deficits are appreciated.  Skin: Edema and erythema noted around the left eye ____________________________________________  ED COURSE:  Pertinent labs & imaging results that were available during my care of the patient were reviewed by me and considered in my medical decision making (see chart  for details). Patient presents for what appears to be an allergic process, we will assess with labs and imaging as indicated. He will receive IV steroids as well as Benadryl and topical antihistamines   Procedures ____________________________________________   LABS (pertinent positives/negatives)  Labs Reviewed  CBC WITH DIFFERENTIAL/PLATELET - Abnormal; Notable for the following:        Result Value   WBC 11.3 (*)    Neutro Abs 8.5 (*)    All other components within normal limits  BASIC METABOLIC PANEL - Abnormal; Notable for the following:    Glucose, Bld 107 (*)    Calcium 8.8 (*)    All other components within normal limits    RADIOLOGY Images were viewed by me  CT of the orbit IMPRESSION: Left dacryoadenitis with 5 mm collection. Associated preseptal cellulitis. ____________________________________________  DIFFERENTIAL DIAGNOSIS   Allergy, dacryoadenitis, orbital cellulitis, periorbital cellulitis, foreign body   FINAL ASSESSMENT AND PLAN  Dacryoadenitis, periorbital cellulitis   Plan: Patient had presented for left eyelid swelling that started on Friday. Patients labs revealed minimally elevated white blood cell count. Patients imaging did reveal evidence of dacryoadenitis and periorbital cellulitis. We'll advise warm compresses as well as steroids and antibiotics and follow-up in 2 days for recheck.   Earleen Newport, MD   Note: This note was generated in part or whole with voice recognition software. Voice recognition is usually quite accurate but there are transcription errors that can and very often do occur. I apologize for any typographical errors that were not detected and corrected.     Earleen Newport, MD 01/21/17 1440

## 2017-01-21 NOTE — ED Triage Notes (Signed)
Pt to ED c/o left eye swelling started Friday and became severely worse today.  Pt eye is swollen, red, states sharp pain when moving the eye.  Denies blurry vision but difficult to see due to swelling.  Denies injury to eye.  Was seen at [facility] and referred to come to ED for CT scan.

## 2017-01-21 NOTE — ED Triage Notes (Addendum)
Left eye red, edematous, and painful x2 days. Left orbital and facial region edematous also.

## 2017-01-21 NOTE — Discharge Instructions (Signed)
Recommend patient go to Emergency Department for further evaluation/management 

## 2017-01-21 NOTE — ED Provider Notes (Signed)
[facility]    CSN: [id number] Arrival date & time: 01/21/17  5732     History   Chief Complaint Chief Complaint  Patient presents with  . Eye Problem    HPI Ian Moore is a 51 y.o. male.   The history is provided by the patient.  Eye Problem  Location:  Left eye Quality:  Aching, dull and tearing Severity:  Severe Onset quality:  Gradual Timing:  Constant Progression:  Worsening Chronicity:  New Context: not burn, not chemical exposure, not contact lens problem, not direct trauma, not foreign body, not using machinery, not scratch, not smoke exposure and not UV exposure   Context comment:  No known injury Relieved by:  None tried Worsened by:  Bright light Ineffective treatments:  None tried Associated symptoms: blurred vision, inflammation, photophobia, redness, swelling and tearing   Associated symptoms comment:  Chills Risk factors: no conjunctival hemorrhage, no exposure to pinkeye, no previous injury to eye, no recent herpes zoster and no recent URI     Past Medical History:  Diagnosis Date  . Hypertension     There are no active problems to display for this patient.   History reviewed. No pertinent surgical history.     Home Medications    Prior to Admission medications   Medication Sig Start Date End Date Taking? Authorizing Provider  amLODipine (NORVASC) 10 MG tablet Take 1 tablet (10 mg total) by mouth daily. 01/12/17  Yes Juline Patch, MD  Triamcinolone Acetonide 0.025 % LOTN Apply 1 application topically 2 (two) times daily. 12/25/16   Juline Patch, MD    Family History History reviewed. No pertinent family history.  Social History Social History  Substance Use Topics  . Smoking status: Former Research scientist (life sciences)  . Smokeless tobacco: Never Used  . Alcohol use Yes     Allergies   Penicillins and Influenza a (h1n1) monoval vac   Review of Systems Review of Systems  Eyes: Positive for blurred vision, photophobia and  redness.     Physical Exam Triage Vital Signs ED Triage Vitals  Enc Vitals Group     BP 01/21/17 1015 (!) 171/100     Pulse Rate 01/21/17 1015 67     Resp 01/21/17 1015 16     Temp 01/21/17 1015 98.3 F (36.8 C)     Temp Source 01/21/17 1015 Oral     SpO2 01/21/17 1015 100 %     Weight 01/21/17 1016 195 lb (88.5 kg)     Height 01/21/17 1016 5\' 5"  (1.651 m)     Head Circumference --      Peak Flow --      Pain Score --      Pain Loc --      Pain Edu? --      Excl. in [place]? --    No data found.   Updated Vital Signs BP (!) 171/100 (BP Location: Left Arm)   Pulse 67   Temp 98.3 F (36.8 C) (Oral)   Resp 16   Ht 5\' 5"  (1.651 m)   Wt 195 lb (88.5 kg)   SpO2 100%   BMI 32.45 kg/m   Visual Acuity Right Eye Distance: 20/30 Left Eye Distance: 20/50 Bilateral Distance: 20/20  Right Eye Near:   Left Eye Near:    Bilateral Near:     Physical Exam  Constitutional: He appears well-developed and well-nourished. No distress.  Eyes: Pupils are equal, round, and reactive to light. Left eye exhibits  chemosis and discharge. Left conjunctiva is injected.  Severe left upper eyelid edema and erythema; severe conjunctival edema; severe pain with extraocular movements  Skin: He is not diaphoretic.  Nursing note and vitals reviewed.    UC Treatments / Results  Labs (all labs ordered are listed, but only abnormal results are displayed) Labs Reviewed - No data to display  EKG  EKG Interpretation None       Radiology No results found.  Procedures Procedures (including critical care time)  Medications Ordered in UC Medications - No data to display   Initial Impression / Assessment and Plan / UC Course  I have reviewed the triage vital signs and the nursing notes.  Pertinent labs & imaging results that were available during my care of the patient were reviewed by me and considered in my medical decision making (see chart for details).       Final Clinical  Impressions(s) / UC Diagnoses   Final diagnoses:  Cellulitis of left orbital region  Periorbital cellulitis of left eye  Left eye pain    New Prescriptions Discharge Medication List as of 01/21/2017 10:56 AM     1. diagnosis reviewed with patient and daughter; recommend patient go to Emergency Department for further evaluation and management. 2. patient in stable condition; will proceed by private vehicle with daughter driving; report called to [facility] ED triage RN  Controlled Substance Prescriptions [place] Controlled Substance Registry consulted? Not Applicable   Norval Gable, MD 01/21/17 1112

## 2017-02-05 ENCOUNTER — Encounter: Payer: Self-pay | Admitting: Family Medicine

## 2017-02-05 ENCOUNTER — Ambulatory Visit: Payer: Commercial Managed Care - PPO | Admitting: Family Medicine

## 2017-02-05 VITALS — BP 138/88 | HR 64 | Ht 65.0 in | Wt 195.0 lb

## 2017-02-05 DIAGNOSIS — I10 Essential (primary) hypertension: Secondary | ICD-10-CM | POA: Diagnosis not present

## 2017-02-05 MED ORDER — AMLODIPINE BESYLATE 10 MG PO TABS: 10.0000 mg | ORAL_TABLET | Freq: Every day | ORAL | 1 refills | Status: DC

## 2017-02-05 NOTE — Progress Notes (Signed)
Name: Ian Moore   MRN: [id number]    DOB: [birth year]   Date:02/05/2017       Progress Note  Subjective  Chief Complaint  Chief Complaint  Patient presents with  . Hypertension    B/P recheck    Hypertension  This is a chronic problem. The current episode started more than 1 year ago. The problem has been gradually improving since onset. The problem is controlled. Pertinent negatives include no anxiety, blurred vision, chest pain, headaches, malaise/fatigue, neck pain, orthopnea, palpitations, peripheral edema, PND, shortness of breath or sweats. There are no associated agents to hypertension. There are no known risk factors for coronary artery disease. Past treatments include calcium channel blockers. The current treatment provides moderate improvement. There are no compliance problems.  There is no history of angina, kidney disease, CAD/MI, CVA, heart failure, left ventricular hypertrophy, PVD or retinopathy. There is no history of chronic renal disease, a hypertension causing med or renovascular disease.    No problem-specific Assessment & Plan notes found for this encounter.   Past Medical History:  Diagnosis Date  . Hypertension     History reviewed. No pertinent surgical history.  History reviewed. No pertinent family history.  Social History   Socioeconomic History  . Marital status: Married    Spouse name: Not on file  . Number of children: Not on file  . Years of education: Not on file  . Highest education level: Not on file  Social Needs  . Financial resource strain: Not on file  . Food insecurity - worry: Not on file  . Food insecurity - inability: Not on file  . Transportation needs - medical: Not on file  . Transportation needs - non-medical: Not on file  Occupational History  . Not on file  Tobacco Use  . Smoking status: Former Research scientist (life sciences)  . Smokeless tobacco: Never Used  Substance and Sexual Activity  . Alcohol use: Yes  . Drug use: No  . Sexual  activity: Not on file  Other Topics Concern  . Not on file  Social History Narrative  . Not on file    Allergies  Allergen Reactions  . Influenza A (H1n1) Monoval Vac Rash  . Penicillins Rash    Has patient had a PCN reaction causing immediate rash, facial/tongue/throat swelling, SOB or lightheadedness with hypotension: No Has patient had a PCN reaction causing severe rash involving mucus membranes or skin necrosis: No Has patient had a PCN reaction that required hospitalization: No Has patient had a PCN reaction occurring within the last 10 years: No If all of the above answers are "NO", then may proceed with Cephalosporin use.     Outpatient Medications Prior to Visit  Medication Sig Dispense Refill  . amLODipine (NORVASC) 10 MG tablet Take 1 tablet (10 mg total) by mouth daily. 90 tablet 1  . Triamcinolone Acetonide 0.025 % LOTN Apply 1 application topically 2 (two) times daily. (Patient not taking: Reported on 01/21/2017) 1 Bottle 1  . clindamycin (CLEOCIN) 300 MG capsule Take 1 capsule (300 mg total) by mouth 3 (three) times daily. 30 capsule 0  . oxyCODONE-acetaminophen (PERCOCET) 5-325 MG tablet Take 1-2 tablets by mouth every 6 (six) hours as needed for moderate pain or severe pain. 20 tablet 0  . predniSONE (STERAPRED UNI-PAK 21 TAB) 10 MG (21) TBPK tablet Take by mouth daily. Dispense steroid taper pack as directed 21 tablet 0   No facility-administered medications prior to visit.     Review of Systems  Constitutional: Negative for chills, fever, malaise/fatigue and weight loss.  HENT: Negative for ear discharge, ear pain and sore throat.   Eyes: Negative for blurred vision.  Respiratory: Negative for cough, sputum production, shortness of breath and wheezing.   Cardiovascular: Negative for chest pain, palpitations, orthopnea, leg swelling and PND.  Gastrointestinal: Negative for abdominal pain, blood in stool, constipation, diarrhea, heartburn, melena and nausea.   Genitourinary: Negative for dysuria, frequency, hematuria and urgency.  Musculoskeletal: Negative for back pain, joint pain, myalgias and neck pain.  Skin: Negative for rash.  Neurological: Negative for dizziness, tingling, sensory change, focal weakness and headaches.  Endo/Heme/Allergies: Negative for environmental allergies and polydipsia. Does not bruise/bleed easily.  Psychiatric/Behavioral: Negative for depression and suicidal ideas. The patient is not nervous/anxious and does not have insomnia.      Objective  Vitals:   02/05/17 1539  BP: 138/88  Pulse: 64  Weight: 195 lb (88.5 kg)  Height: 5\' 5"  (1.651 m)    Physical Exam  Constitutional: He is oriented to person, place, and time and well-developed, well-nourished, and in no distress.  HENT:  Head: Normocephalic.  Right Ear: External ear normal.  Left Ear: External ear normal.  Nose: Nose normal.  Mouth/Throat: Oropharynx is clear and moist.  Eyes: Conjunctivae and EOM are normal. Pupils are equal, round, and reactive to light. Right eye exhibits no discharge. Left eye exhibits no discharge. No scleral icterus.  Neck: Normal range of motion. Neck supple. No JVD present. No tracheal deviation present. No thyromegaly present.  Cardiovascular: Normal rate, regular rhythm, normal heart sounds and intact distal pulses. Exam reveals no gallop and no friction rub.  No murmur heard. Pulmonary/Chest: Breath sounds normal. No respiratory distress. He has no wheezes. He has no rales.  Abdominal: Soft. Bowel sounds are normal. He exhibits no mass. There is no hepatosplenomegaly. There is no tenderness. There is no rebound, no guarding and no CVA tenderness.  Musculoskeletal: Normal range of motion. He exhibits no edema or tenderness.  Lymphadenopathy:    He has no cervical adenopathy.  Neurological: He is alert and oriented to person, place, and time. He has normal sensation, normal strength, normal reflexes and intact cranial  nerves. No cranial nerve deficit.  Skin: Skin is warm. No rash noted.  Psychiatric: Mood and affect normal.  Nursing note and vitals reviewed.     Assessment & Plan  Problem List Items Addressed This Visit    None    Visit Diagnoses    Essential hypertension    -  Primary   Relevant Medications   amLODipine (NORVASC) 10 MG tablet      Meds ordered this encounter  Medications  . amLODipine (NORVASC) 10 MG tablet    Sig: Take 1 tablet (10 mg total) daily by mouth.    Dispense:  90 tablet    Refill:  1      Dr. Otilio Miu [facility]  02/05/17

## 2017-04-10 ENCOUNTER — Encounter: Payer: Self-pay | Admitting: *Deleted

## 2017-04-20 ENCOUNTER — Telehealth: Payer: Self-pay | Admitting: Gastroenterology

## 2017-04-20 NOTE — Telephone Encounter (Signed)
Almyra Free patient's daughter called to schedule a colonoscopy with Dr Allen Norris see referral. He will need an interpreter.

## 2017-04-21 NOTE — Telephone Encounter (Signed)
Returned Julia's call to schedule her fathers colonoscopy.  Had to leave a message for her to call me back.  Thanks [employer]

## 2017-07-06 ENCOUNTER — Other Ambulatory Visit: Payer: Self-pay

## 2017-08-18 ENCOUNTER — Ambulatory Visit: Payer: Commercial Managed Care - PPO | Admitting: Family Medicine

## 2020-03-22 ENCOUNTER — Encounter: Payer: Self-pay | Admitting: Family Medicine

## 2020-03-22 ENCOUNTER — Other Ambulatory Visit: Payer: Self-pay

## 2020-03-22 ENCOUNTER — Ambulatory Visit: Payer: Self-pay | Admitting: Family Medicine

## 2020-03-22 VITALS — BP 171/89 | HR 57 | Temp 97.5°F | Resp 18 | Ht 65.5 in | Wt 202.0 lb

## 2020-03-22 DIAGNOSIS — R29818 Other symptoms and signs involving the nervous system: Secondary | ICD-10-CM

## 2020-03-22 DIAGNOSIS — Z887 Allergy status to serum and vaccine status: Secondary | ICD-10-CM

## 2020-03-22 DIAGNOSIS — G4730 Sleep apnea, unspecified: Secondary | ICD-10-CM | POA: Insufficient documentation

## 2020-03-22 DIAGNOSIS — Z7689 Persons encountering health services in other specified circumstances: Secondary | ICD-10-CM | POA: Diagnosis not present

## 2020-03-22 DIAGNOSIS — I10 Essential (primary) hypertension: Secondary | ICD-10-CM | POA: Diagnosis not present

## 2020-03-22 DIAGNOSIS — R0683 Snoring: Secondary | ICD-10-CM

## 2020-03-22 MED ORDER — AMLODIPINE BESYLATE 10 MG PO TABS: 10.0000 mg | ORAL_TABLET | Freq: Every day | ORAL | 1 refills | Status: DC

## 2020-03-22 NOTE — Assessment & Plan Note (Signed)
See suspected sleep apnea AP

## 2020-03-22 NOTE — Patient Instructions (Signed)
A referral for sleep study has been placed today.  If you have not heard from the specialty office or our referral coordinator within 1 week, please let us know and we will follow up with the referral coordinator for an update.  A referral to allergist has been placed today.  If you have not heard from the specialty office or our referral coordinator within 1 week, please let us know and we will follow up with the referral coordinator for an update.  I have sent in a prescription for amlodipine 10mg  to take 1 tablet daily for your blood pressure.  We will plan to see you back in 2 weeks for hypertension follow up visit  You will receive a survey after today's visit either digitally by e-mail or paper by [place] mail. Your experiences and feedback matter to [place].  Please respond so we know how we are doing as we provide care for you.  Call us with any questions/concerns/needs.  It is my goal to be available to you for your health concerns.  Thanks for choosing me to be a partner in your healthcare needs!  Harlin Rain, FNP-C Family Nurse Practitioner [facility] Phone: [phone]

## 2020-03-22 NOTE — Assessment & Plan Note (Signed)
Requesting referral to allergist due to history of severe allergy to influenza vaccine and needing to be vaccinated against COVID with employer.  Referral placed.

## 2020-03-22 NOTE — Assessment & Plan Note (Signed)
Uncontrolled hypertension.  BP is not at goal < 130/80.  Pt is not working on lifestyle modifications.    Complications:  Obesity, Suspected OSA  Plan: 1. RESTART amlodipine 10mg  daily 2. Obtain labs at next visit  3. Encouraged heart healthy diet and increasing exercise to 30 minutes most days of the week, going no more than 2 days in a row without exercise. 4. Check BP 1-2 x per week at home, keep log, and bring to clinic at next appointment. 5. Follow up 2 weeks.

## 2020-03-22 NOTE — Assessment & Plan Note (Signed)
New patient establishment at [facility] for primary care.

## 2020-03-22 NOTE — Assessment & Plan Note (Signed)
Suspected sleep apnea.  Referral for sleep study placed.

## 2020-03-22 NOTE — Progress Notes (Signed)
Establishing care

## 2020-03-22 NOTE — Progress Notes (Signed)
Subjective:    Patient ID: Ian Moore, male    DOB: 04/01/1965, 54 y.o.   MRN: [id number]  Ian Moore is a 54 y.o. male presenting on 03/22/2020 for Establish Care, Hypertension, and Snoring (Stopping breathing while sleeping, napping throughout the day. The pt daughter states the sleeping issues have worsen over the last few months)   HPI  Ian Moore presents to clinic for new patient establishment.  Previous PCP was at [facility].  Records will not be requested, as available in Epic.  Past medical, family, and surgical history reviewed w/ pt.  Has acute concerns for suspected sleep apnea, concerns for snoring and periods of apnea at night.  Was previously on medication for hypertension but has not been on medications for > 2 years.  Denies headaches, visual changes, chest pain, palpitations, leg swelling.   Depression screen [facility] 2/9 12/25/2016  Decreased Interest 0  Down, Depressed, Hopeless 0  PHQ - 2 Score 0  Altered sleeping 0  Tired, decreased energy 0  Change in appetite 0  Feeling bad or failure about yourself  0  Trouble concentrating 0  Moving slowly or fidgety/restless 0  Suicidal thoughts 0  PHQ-9 Score 0    Social History   Tobacco Use  . Smoking status: Former Research scientist (life sciences)  . Smokeless tobacco: Never Used  Vaping Use  . Vaping Use: Never used  Substance Use Topics  . Alcohol use: Never  . Drug use: No    Review of Systems  Constitutional: Negative.   HENT: Negative.   Eyes: Negative.   Respiratory: Negative.        Snoring, periods of apnea at night  Cardiovascular: Negative.   Gastrointestinal: Negative.   Endocrine: Negative.   Genitourinary: Negative.   Musculoskeletal: Negative.   Skin: Negative.   Allergic/Immunologic: Negative.   Neurological: Negative.   Hematological: Negative.   Psychiatric/Behavioral: Negative.    Per HPI unless specifically indicated above     Objective:    BP (!) 171/89 (BP Location: Right Arm,  Patient Position: Sitting, Cuff Size: Large)   Pulse (!) 57   Temp (!) 97.5 F (36.4 C) (Temporal)   Resp 18   Ht 5' 5.5" (1.664 m)   Wt 202 lb (91.6 kg)   SpO2 99%   BMI 33.10 kg/m   Wt Readings from Last 3 Encounters:  03/22/20 202 lb (91.6 kg)  02/05/17 195 lb (88.5 kg)  01/21/17 195 lb (88.5 kg)    Physical Exam Vitals and nursing note reviewed.  Constitutional:      General: He is not in acute distress.    Appearance: Normal appearance. He is well-developed and well-groomed. He is obese. He is not ill-appearing or toxic-appearing.  HENT:     Head: Normocephalic and atraumatic.     Nose:     Comments: Ian Moore is in place, covering mouth and nose. Eyes:     General:        Right eye: No discharge.        Left eye: No discharge.     Extraocular Movements: Extraocular movements intact.     Conjunctiva/sclera: Conjunctivae normal.     Pupils: Pupils are equal, round, and reactive to light.  Cardiovascular:     Rate and Rhythm: Normal rate and regular rhythm.     Pulses: Normal pulses.     Heart sounds: Normal heart sounds. No murmur heard. No friction rub. No gallop.   Pulmonary:     Effort: Pulmonary effort  is normal. No respiratory distress.     Breath sounds: Normal breath sounds.  Musculoskeletal:     Right lower leg: No edema.     Left lower leg: No edema.  Skin:    General: Skin is warm and dry.     Capillary Refill: Capillary refill takes less than 2 seconds.  Neurological:     General: No focal deficit present.     Mental Status: He is alert and oriented to person, place, and time.  Psychiatric:        Attention and Perception: Attention and perception normal.        Mood and Affect: Mood and affect normal.        Speech: Speech normal.        Behavior: Behavior normal. Behavior is cooperative.        Thought Content: Thought content normal.        Cognition and Memory: Cognition and memory normal.    Results for orders placed or performed during the  hospital encounter of 01/21/17  CBC with Differential  Result Value Ref Range   WBC 11.3 (H) 3.8 - 10.6 K/uL   RBC 5.31 4.40 - 5.90 MIL/uL   Hemoglobin 15.4 13.0 - 18.0 g/dL   HCT 44.8 40.0 - 52.0 %   MCV 84.3 80.0 - 100.0 fL   MCH 29.0 26.0 - 34.0 pg   MCHC 34.4 32.0 - 36.0 g/dL   RDW 12.6 11.5 - 14.5 %   Platelets 232 150 - 440 K/uL   Neutrophils Relative % 75 %   Neutro Abs 8.5 (H) 1.4 - 6.5 K/uL   Lymphocytes Relative 17 %   Lymphs Abs 1.9 1.0 - 3.6 K/uL   Monocytes Relative 6 %   Monocytes Absolute 0.7 0.2 - 1.0 K/uL   Eosinophils Relative 1 %   Eosinophils Absolute 0.1 0 - 0.7 K/uL   Basophils Relative 1 %   Basophils Absolute 0.1 0 - 0.1 K/uL  Basic metabolic panel  Result Value Ref Range   Sodium 136 135 - 145 mmol/L   Potassium 3.5 3.5 - 5.1 mmol/L   Chloride 102 101 - 111 mmol/L   CO2 25 22 - 32 mmol/L   Glucose, Bld 107 (H) 65 - 99 mg/dL   BUN 14 6 - 20 mg/dL   Creatinine, Ser 0.93 0.61 - 1.24 mg/dL   Calcium 8.8 (L) 8.9 - 10.3 mg/dL   GFR calc non Af Amer >60 >60 mL/min   GFR calc Af Amer >60 >60 mL/min   Anion gap 9 5 - 15      Assessment & Plan:   Problem List Items Addressed This Visit      Cardiovascular and Mediastinum   Essential hypertension    Uncontrolled hypertension.  BP is not at goal < 130/80.  Pt is not working on lifestyle modifications.    Complications:  Obesity, Suspected OSA  Plan: 1. RESTART amlodipine 10mg  daily 2. Obtain labs at next visit  3. Encouraged heart healthy diet and increasing exercise to 30 minutes most days of the week, going no more than 2 days in a row without exercise. 4. Check BP 1-2 x per week at home, keep log, and bring to clinic at next appointment. 5. Follow up 2 weeks.       Relevant Medications   amLODipine (NORVASC) 10 MG tablet     Other   Suspected sleep apnea    Suspected sleep apnea.  Referral for sleep study  placed.      Relevant Orders   Nocturnal polysomnography   Snoring    See  suspected sleep apnea AP      Relevant Orders   Nocturnal polysomnography   History of influenza vaccine allergy    Requesting referral to allergist due to history of severe allergy to influenza vaccine and needing to be vaccinated against COVID with employer.  Referral placed.      Relevant Orders   Ambulatory referral to Allergy   Encounter to establish care with new doctor - Primary    New patient establishment at [facility] for primary care.           Meds ordered this encounter  Medications  . amLODipine (NORVASC) 10 MG tablet    Sig: Take 1 tablet (10 mg total) by mouth daily.    Dispense:  90 tablet    Refill:  1   Follow up plan: Return in about 2 weeks (around 04/05/2020) for HTN F/U.   Harlin Rain, [place] [facility] Nurse Practitioner [facility] 03/22/2020, 2:35 PM

## 2020-03-28 ENCOUNTER — Telehealth: Payer: Self-pay | Admitting: Family Medicine

## 2020-03-28 NOTE — Telephone Encounter (Signed)
Ian Moore is calling from [facility] is calling to report that the office does not do allergy testing for COVID testing. Patient would need to go to a teaching hospital [facility] or [facility]. Please advise [phone]

## 2020-03-29 NOTE — Telephone Encounter (Signed)
Note sent to referral coordinator  

## 2020-04-05 ENCOUNTER — Telehealth: Payer: Commercial Managed Care - PPO | Admitting: Family Medicine

## 2020-04-12 ENCOUNTER — Ambulatory Visit (INDEPENDENT_AMBULATORY_CARE_PROVIDER_SITE_OTHER): Payer: Commercial Managed Care - PPO | Admitting: Family Medicine

## 2020-04-12 ENCOUNTER — Encounter: Payer: Self-pay | Admitting: Family Medicine

## 2020-04-12 ENCOUNTER — Other Ambulatory Visit: Payer: Self-pay

## 2020-04-12 VITALS — BP 135/68 | HR 79

## 2020-04-12 DIAGNOSIS — I10 Essential (primary) hypertension: Secondary | ICD-10-CM

## 2020-04-12 MED ORDER — HYDROCHLOROTHIAZIDE 12.5 MG PO TABS: 12.5000 mg | ORAL_TABLET | Freq: Every day | ORAL | 0 refills | Status: DC

## 2020-04-12 NOTE — Progress Notes (Signed)
Virtual Visit via Telephone  The purpose of this virtual visit is to provide medical care while limiting exposure to the novel coronavirus (COVID19) for both patient and office staff.  Consent was obtained for phone visit:  Yes.   Answered questions that patient had about telehealth interaction:  Yes.   I discussed the limitations, risks, security and privacy concerns of performing an evaluation and management service by telephone. I also discussed with the patient that there may be a patient responsible charge related to this service. The patient expressed understanding and agreed to proceed.  Patient is at home and is accessed via telephone Services are provided by Harlin Rain, FNP-C from [facility])  ---------------------------------------------------------------------- Chief Complaint  Patient presents with  . Hypertension    The pt daughter ask about the status of her referral to [place]. I gave the patient the phone number to call and schedule the appt.     S: Reviewed CMA documentation. I have called patient and gathered additional HPI as follows:   Mr. Casasola presents for virtual telemedicine visit via telephone with daughter, Almyra Free.  Almyra Free reports that patient's BP has been running 130-170 SBP depending on activity that he has been doing.  Denies any headaches visual changes, lightheadedness, chest pain, palpitations, or leg swelling.  Patient is currently working  Denies any high risk travel to areas of current concern for [place]. Denies any known or suspected exposure to person with or possibly with COVID19.  Past Medical History:  Diagnosis Date  . Hypertension    Social History   Tobacco Use  . Smoking status: Former Research scientist (life sciences)  . Smokeless tobacco: Never Used  Vaping Use  . Vaping Use: Never used  Substance Use Topics  . Alcohol use: Never  . Drug use: No    Current Outpatient Medications:  .  amLODipine  (NORVASC) 10 MG tablet, Take 1 tablet (10 mg total) by mouth daily., Disp: 90 tablet, Rfl: 1 .  hydrochlorothiazide (HYDRODIURIL) 12.5 MG tablet, Take 1 tablet (12.5 mg total) by mouth daily., Disp: 90 tablet, Rfl: 0 .  Triamcinolone Acetonide 0.025 % LOTN, Apply 1 application topically 2 (two) times daily. (Patient not taking: Reported on 04/12/2020), Disp: 1 Bottle, Rfl: 1  Depression screen [facility] 2/9 12/25/2016  Decreased Interest 0  Down, Depressed, Hopeless 0  PHQ - 2 Score 0  Altered sleeping 0  Tired, decreased energy 0  Change in appetite 0  Feeling bad or failure about yourself  0  Trouble concentrating 0  Moving slowly or fidgety/restless 0  Suicidal thoughts 0  PHQ-9 Score 0    No flowsheet data found.  -------------------------------------------------------------------------- O: No physical exam performed due to remote telephone encounter.  Physical Exam: Patient remotely monitored without video.  Verbal communication appropriate.  Cognition normal.  No results found for this or any previous visit (from the past 2160 hour(s)).  -------------------------------------------------------------------------- A&P:  Problem List Items Addressed This Visit      Cardiovascular and Mediastinum   Essential hypertension - Primary    Uncontrolled hypertension.  BP is not at goal < 130/80.  Pt reports working on lifestyle modifications.  Taking medications tolerating well without side effects.  Complications:  Obesity, suspected OSA  Plan: 1. Continue taking amlodipine 102m daily and BEGIN hydrochlorothiazide 12.537mdaily 2. Obtain labs in 2 weeks  3. Encouraged heart healthy diet and increasing exercise to 30 minutes most days of the week, going no more than 2 days  in a row without exercise. 4. Check BP 1-2 x per week at home, keep log, and bring to clinic at next appointment. 5. Follow up 2 weeks.       Relevant Medications   hydrochlorothiazide (HYDRODIURIL) 12.5 MG tablet    Other Relevant Orders   CMP14+EGFR      Meds ordered this encounter  Medications  . hydrochlorothiazide (HYDRODIURIL) 12.5 MG tablet    Sig: Take 1 tablet (12.5 mg total) by mouth daily.    Dispense:  90 tablet    Refill:  0    Follow-up: - Return in 2 weeks for hypertension follow up visit  Patient verbalizes understanding with the above medical recommendations including the limitation of remote medical advice.  Specific follow-up and call-back criteria were given for patient to follow-up or seek medical care more urgently if needed.  - Time spent in direct consultation with patient on phone: 7 minutes  Harlin Rain, [facility] 04/12/2020, 3:24 PM

## 2020-04-13 NOTE — Assessment & Plan Note (Signed)
Uncontrolled hypertension.  BP is not at goal < 130/80.  Pt reports working on lifestyle modifications.  Taking medications tolerating well without side effects.  Complications:  Obesity, suspected OSA  Plan: 1. Continue taking amlodipine 10mg  daily and BEGIN hydrochlorothiazide 12.5mg  daily 2. Obtain labs in 2 weeks  3. Encouraged heart healthy diet and increasing exercise to 30 minutes most days of the week, going no more than 2 days in a row without exercise. 4. Check BP 1-2 x per week at home, keep log, and bring to clinic at next appointment. 5. Follow up 2 weeks.

## 2020-06-08 ENCOUNTER — Ambulatory Visit: Payer: Self-pay | Admitting: Allergy

## 2020-10-10 ENCOUNTER — Other Ambulatory Visit: Payer: Self-pay

## 2021-04-11 ENCOUNTER — Inpatient Hospital Stay
Admission: AD | Admit: 2021-04-11 | Payer: Commercial Managed Care - PPO | Source: Other Acute Inpatient Hospital | Admitting: Neurology

## 2021-04-11 ENCOUNTER — Emergency Department: Payer: Commercial Managed Care - PPO

## 2021-04-11 ENCOUNTER — Inpatient Hospital Stay: Payer: Commercial Managed Care - PPO

## 2021-04-11 ENCOUNTER — Other Ambulatory Visit: Payer: Self-pay

## 2021-04-11 ENCOUNTER — Inpatient Hospital Stay
Admission: EM | Admit: 2021-04-11 | Discharge: 2021-04-14 | DRG: 064 | Disposition: A | Discharge status: Home / Self Care | Payer: Commercial Managed Care - PPO | Attending: Internal Medicine | Admitting: Internal Medicine

## 2021-04-11 DIAGNOSIS — Z6831 Body mass index (BMI) 31.0-31.9, adult: Secondary | ICD-10-CM | POA: Diagnosis not present

## 2021-04-11 DIAGNOSIS — I639 Cerebral infarction, unspecified: Secondary | ICD-10-CM | POA: Diagnosis not present

## 2021-04-11 DIAGNOSIS — I161 Hypertensive emergency: Secondary | ICD-10-CM | POA: Diagnosis present

## 2021-04-11 DIAGNOSIS — Z8673 Personal history of transient ischemic attack (TIA), and cerebral infarction without residual deficits: Secondary | ICD-10-CM | POA: Diagnosis not present

## 2021-04-11 DIAGNOSIS — S06310A Contusion and laceration of right cerebrum without loss of consciousness, initial encounter: Secondary | ICD-10-CM | POA: Diagnosis not present

## 2021-04-11 DIAGNOSIS — I618 Other nontraumatic intracerebral hemorrhage: Secondary | ICD-10-CM | POA: Diagnosis not present

## 2021-04-11 DIAGNOSIS — E7849 Other hyperlipidemia: Secondary | ICD-10-CM | POA: Diagnosis not present

## 2021-04-11 DIAGNOSIS — R739 Hyperglycemia, unspecified: Secondary | ICD-10-CM | POA: Diagnosis not present

## 2021-04-11 DIAGNOSIS — E876 Hypokalemia: Secondary | ICD-10-CM | POA: Diagnosis not present

## 2021-04-11 DIAGNOSIS — E669 Obesity, unspecified: Secondary | ICD-10-CM | POA: Diagnosis not present

## 2021-04-11 DIAGNOSIS — Z20822 Contact with and (suspected) exposure to covid-19: Secondary | ICD-10-CM | POA: Diagnosis not present

## 2021-04-11 DIAGNOSIS — R7303 Prediabetes: Secondary | ICD-10-CM | POA: Diagnosis not present

## 2021-04-11 DIAGNOSIS — I444 Left anterior fascicular block: Secondary | ICD-10-CM | POA: Diagnosis present

## 2021-04-11 DIAGNOSIS — E785 Hyperlipidemia, unspecified: Secondary | ICD-10-CM | POA: Diagnosis present

## 2021-04-11 DIAGNOSIS — R200 Anesthesia of skin: Secondary | ICD-10-CM | POA: Diagnosis not present

## 2021-04-11 DIAGNOSIS — G936 Cerebral edema: Secondary | ICD-10-CM | POA: Diagnosis present

## 2021-04-11 DIAGNOSIS — I10 Essential (primary) hypertension: Secondary | ICD-10-CM | POA: Diagnosis present

## 2021-04-11 DIAGNOSIS — I619 Nontraumatic intracerebral hemorrhage, unspecified: Secondary | ICD-10-CM

## 2021-04-11 DIAGNOSIS — R531 Weakness: Secondary | ICD-10-CM | POA: Diagnosis not present

## 2021-04-11 DIAGNOSIS — R29701 NIHSS score 1: Secondary | ICD-10-CM | POA: Diagnosis not present

## 2021-04-11 DIAGNOSIS — Z87891 Personal history of nicotine dependence: Secondary | ICD-10-CM

## 2021-04-11 DIAGNOSIS — G8194 Hemiplegia, unspecified affecting left nondominant side: Secondary | ICD-10-CM | POA: Diagnosis not present

## 2021-04-11 DIAGNOSIS — R0602 Shortness of breath: Secondary | ICD-10-CM

## 2021-04-11 DIAGNOSIS — I610 Nontraumatic intracerebral hemorrhage in hemisphere, subcortical: Secondary | ICD-10-CM | POA: Diagnosis not present

## 2021-04-11 DIAGNOSIS — I6930 Unspecified sequelae of cerebral infarction: Secondary | ICD-10-CM | POA: Diagnosis not present

## 2021-04-11 DIAGNOSIS — I669 Occlusion and stenosis of unspecified cerebral artery: Secondary | ICD-10-CM | POA: Diagnosis not present

## 2021-04-11 LAB — COMPREHENSIVE METABOLIC PANEL
ALT: 37 U/L (ref 0–44)
AST: 33 U/L (ref 15–41)
Albumin: 3.9 g/dL (ref 3.5–5.0)
Alkaline Phosphatase: 49 U/L (ref 38–126)
Anion gap: 11 (ref 5–15)
BUN: 17 mg/dL (ref 6–20)
CO2: 23 mmol/L (ref 22–32)
Calcium: 8.9 mg/dL (ref 8.9–10.3)
Chloride: 102 mmol/L (ref 98–111)
Creatinine, Ser: 0.95 mg/dL (ref 0.61–1.24)
GFR, Estimated: >60 mL/min (ref >60)
Glucose, Bld: 155 mg/dL — ABNORMAL HIGH (ref 70–99)
Potassium: 2.7 mmol/L — CL (ref 3.5–5.1)
Sodium: 136 mmol/L (ref 135–145)
Total Bilirubin: 0.9 mg/dL (ref 0.3–1.2)
Total Protein: 8 g/dL (ref 6.5–8.1)

## 2021-04-11 LAB — HEMOGLOBIN A1C
Hgb A1c MFr Bld: 5.9 % — ABNORMAL HIGH (ref 4.8–5.6)
Mean Plasma Glucose: 122.63 mg/dL

## 2021-04-11 LAB — LIPID PANEL
Cholesterol: 181 mg/dL (ref 0–200)
HDL: 48 mg/dL (ref >40)
LDL Cholesterol: 105 mg/dL — ABNORMAL HIGH (ref 0–99)
Total CHOL/HDL Ratio: 3.8 RATIO
Triglycerides: 142 mg/dL (ref <150)
VLDL: 28 mg/dL (ref 0–40)

## 2021-04-11 LAB — PROTIME-INR
INR: 1.1 (ref 0.8–1.2)
Prothrombin Time: 14.2 seconds (ref 11.4–15.2)

## 2021-04-11 LAB — DIFFERENTIAL
Abs Immature Granulocytes: 0.03 10*3/uL (ref 0.00–0.07)
Basophils Absolute: 0.1 10*3/uL (ref 0.0–0.1)
Basophils Relative: 1 %
Eosinophils Absolute: 0.1 10*3/uL (ref 0.0–0.5)
Eosinophils Relative: 1 %
Immature Granulocytes: 0 %
Lymphocytes Relative: 24 %
Lymphs Abs: 1.9 10*3/uL (ref 0.7–4.0)
Monocytes Absolute: 0.5 10*3/uL (ref 0.1–1.0)
Monocytes Relative: 6 %
Neutro Abs: 5.5 10*3/uL (ref 1.7–7.7)
Neutrophils Relative %: 68 %

## 2021-04-11 LAB — CBC
HCT: 42.1 % (ref 39.0–52.0)
Hemoglobin: 14.7 g/dL (ref 13.0–17.0)
MCH: 29.2 pg (ref 26.0–34.0)
MCHC: 34.9 g/dL (ref 30.0–36.0)
MCV: 83.5 fL (ref 80.0–100.0)
Platelets: 184 10*3/uL (ref 150–400)
RBC: 5.04 MIL/uL (ref 4.22–5.81)
RDW: 12.5 % (ref 11.5–15.5)
WBC: 8 10*3/uL (ref 4.0–10.5)
nRBC: 0 % (ref 0.0–0.2)

## 2021-04-11 LAB — RESP PANEL BY RT-PCR (FLU A&B, COVID) ARPGX2
Influenza A by PCR: NEGATIVE
Influenza B by PCR: NEGATIVE
SARS Coronavirus 2 by RT PCR: NEGATIVE

## 2021-04-11 LAB — TSH: TSH: 1.662 u[IU]/mL (ref 0.350–4.500)

## 2021-04-11 LAB — APTT: aPTT: 33 seconds (ref 24–36)

## 2021-04-11 IMAGING — CT CT HEAD W/O CM
3 of 4 series · 17 of 40 positions shown, 19 images · non-contrast
Comparison: CT examination performed earlier on the same date at

CLINICAL DATA: Stroke follow-up



[Series 2: head bone · axial · 0.46mm/px · z∈[-57,+49]mm · 7 of 77 slices shown]
[im 8/77  bone]
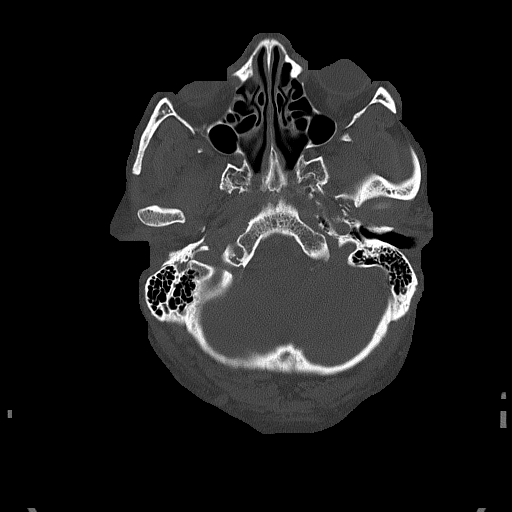
[im 16/77  bone]
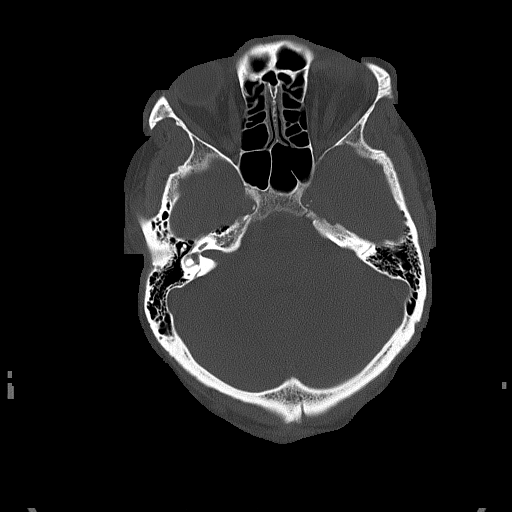
[im 23/77  bone]
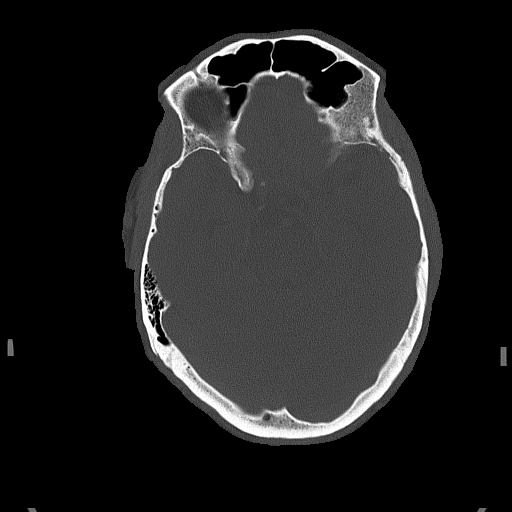
[im 35/77  bone]
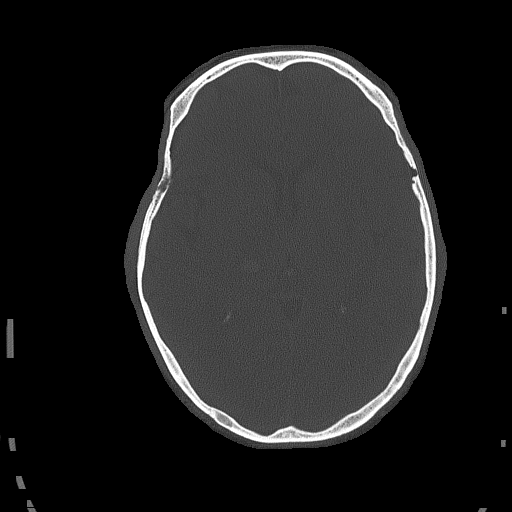
[im 42/77  bone]
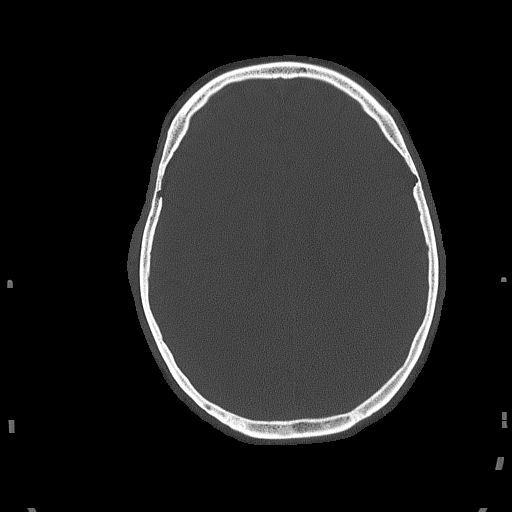
[im 54/77  bone]
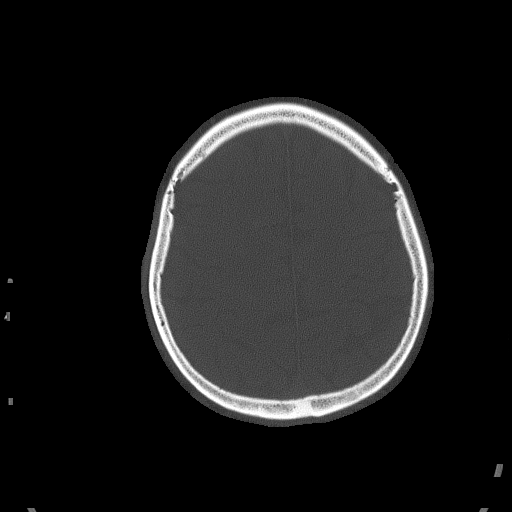
[im 61/77  bone]
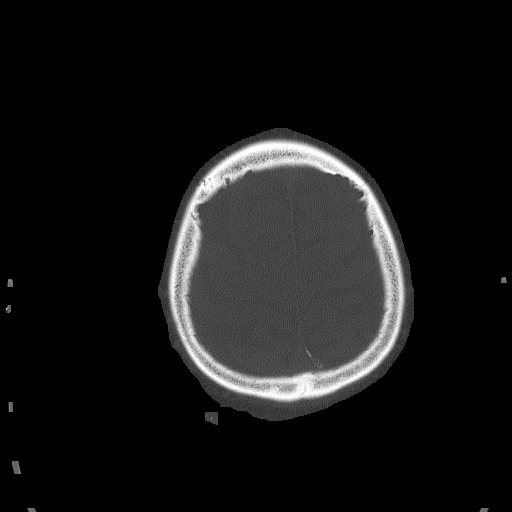

[Series 3: coronal soft tissue · coronal · 0.31mm/px · 3 of 67 slices shown]
[im 17/67  brain]
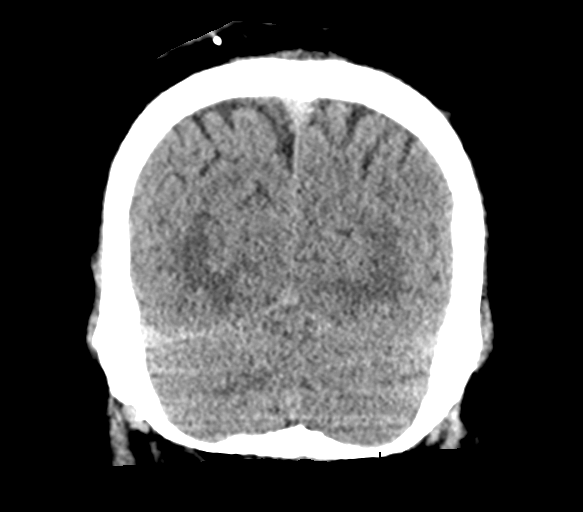
[im 34/67  brain]
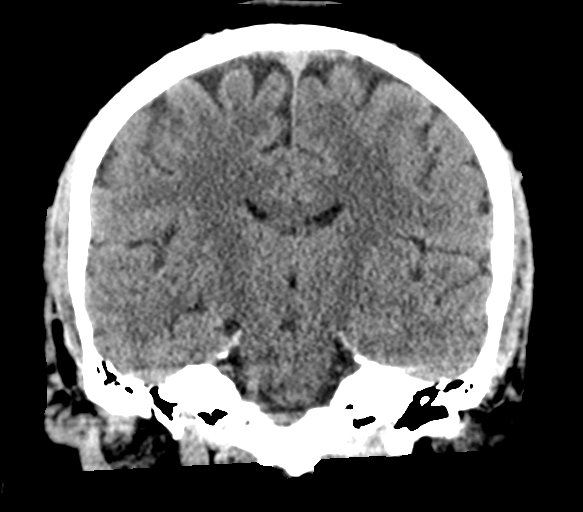
[im 50/67  brain]
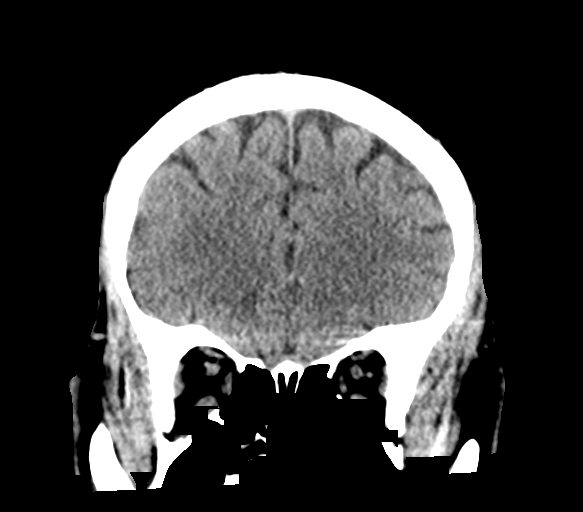

[Series 5: head wo · axial · 0.46mm/px · z∈[-56,+59]mm · 7 of 31 slices shown, 9 images]
[im 4/31  brain]
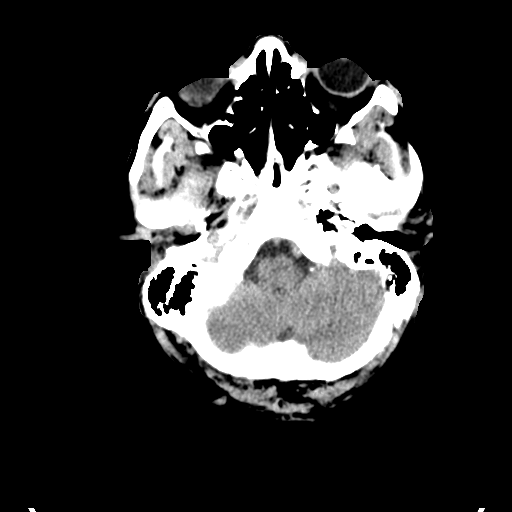
[im 4/31  bone]
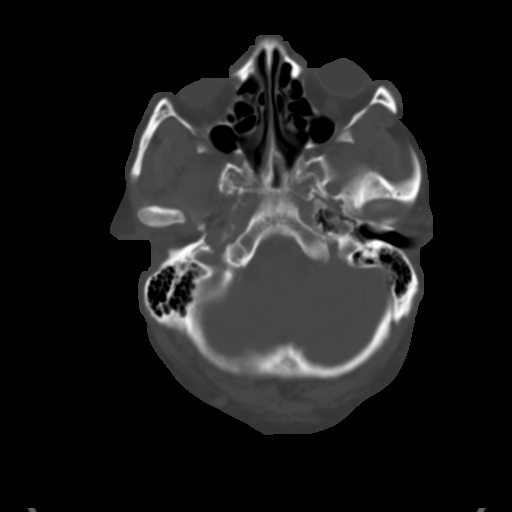
[im 8/31  brain]
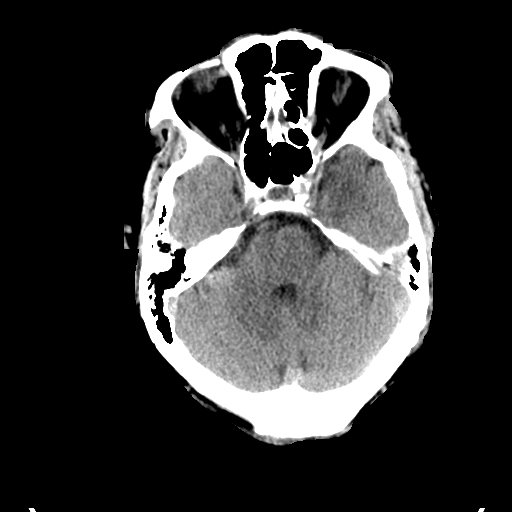
[im 12/31  brain]
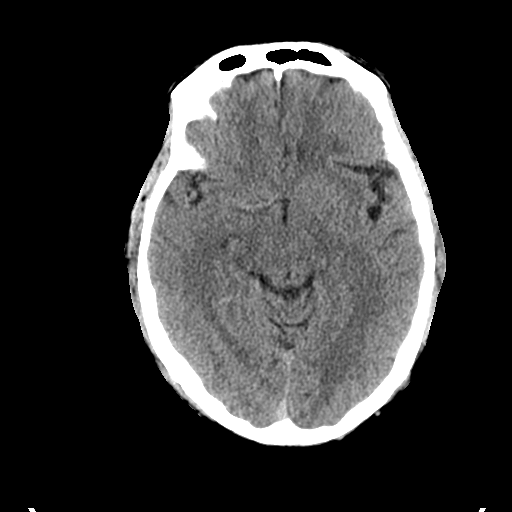
[im 16/31  brain]
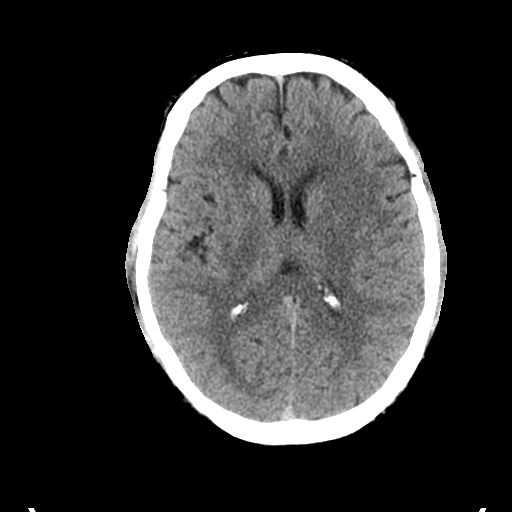
[im 19/31  brain]
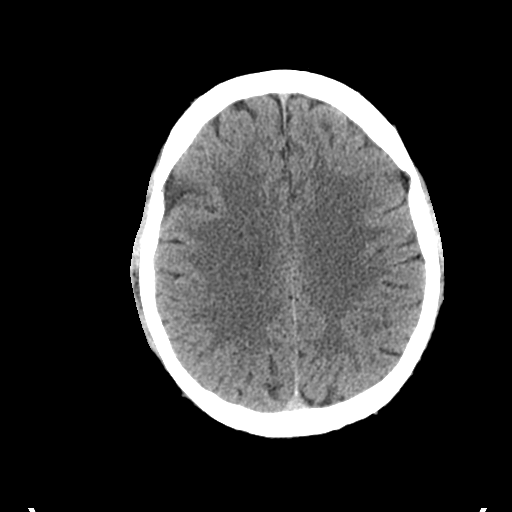
[im 19/31  bone]
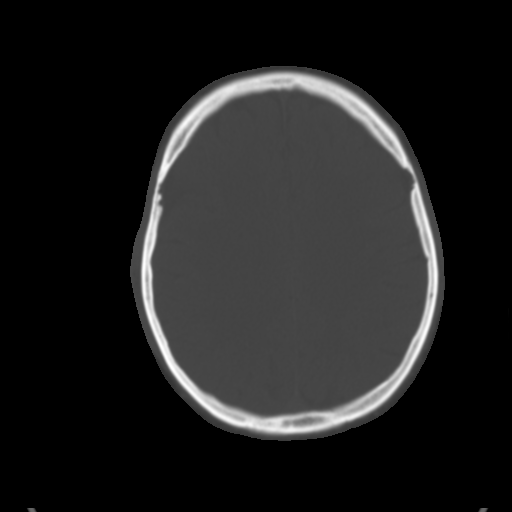
[im 23/31  brain]
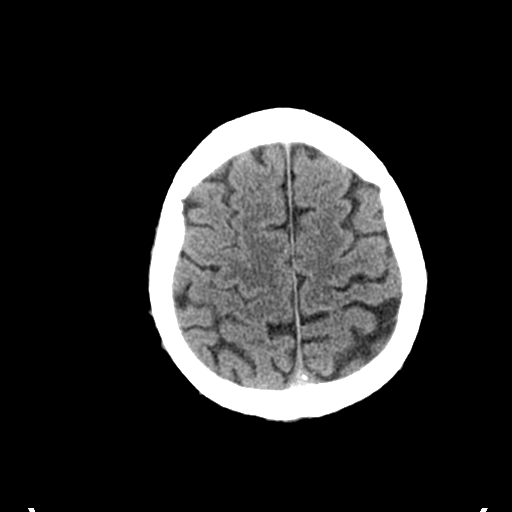
[im 27/31  brain]
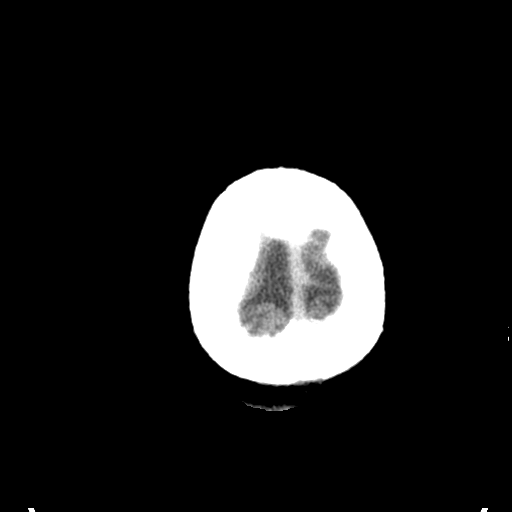

[17 of 40 positions shown; findings below may reference images not displayed]

FINDINGS: Brain: 8 x 6 x 8 mm parenchymal hemorrhage in the right thalamus
with mild surrounding edema is unchanged. No new hemorrhage. No
evidence of hydrocephalus, extra-axial collection.

Vascular: No hyperdense vessel or unexpected calcification.

Skull: Normal. Negative for fracture or focal lesion.

Sinuses/Orbits: No acute finding.

Other: None.
IMPRESSION: Stable right thalamic acute intraparenchymal hemorrhage with mild
surrounding edema. No new hemorrhage. No hydrocephalus or midline
shift.

## 2021-04-11 IMAGING — CT CT HEAD W/O CM
4 series · 16 of 47 positions shown, 18 images · non-contrast
Comparison: No prior CT head, correlation is made with CT orbits
[DATE].

CLINICAL DATA: Stroke suspected, left-sided numbness



[Series 2: head bone · axial · 0.45mm/px · z∈[-124,-92]mm · 3 of 79 slices shown]
[im 8/79  bone]
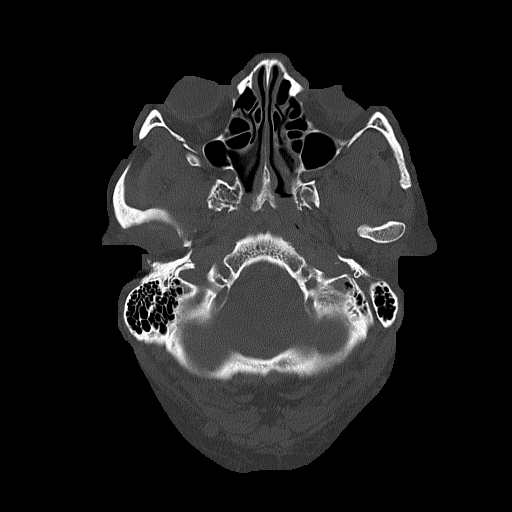
[im 16/79  bone]
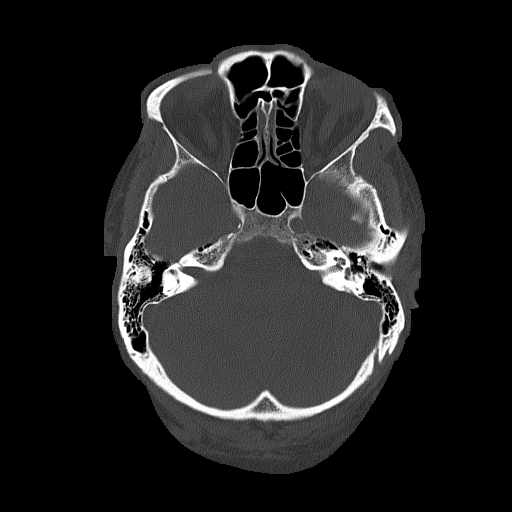
[im 24/79  bone]
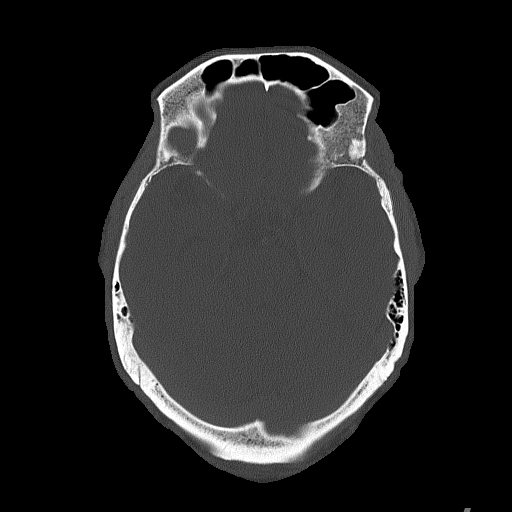

[Series 3: head wo · axial · 0.45mm/px · z∈[-123,-3]mm · 7 of 32 slices shown, 9 images]
[im 4/32  brain]
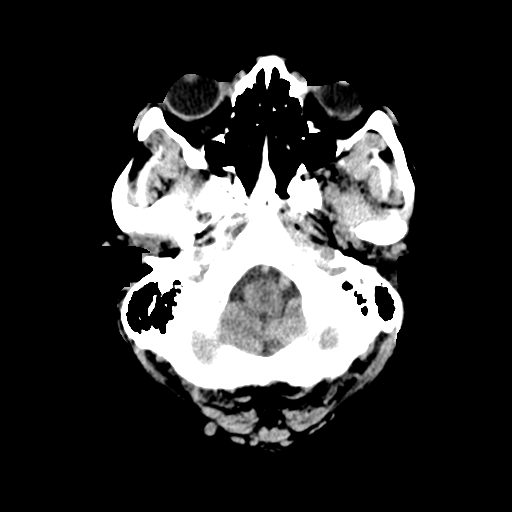
[im 4/32  bone]
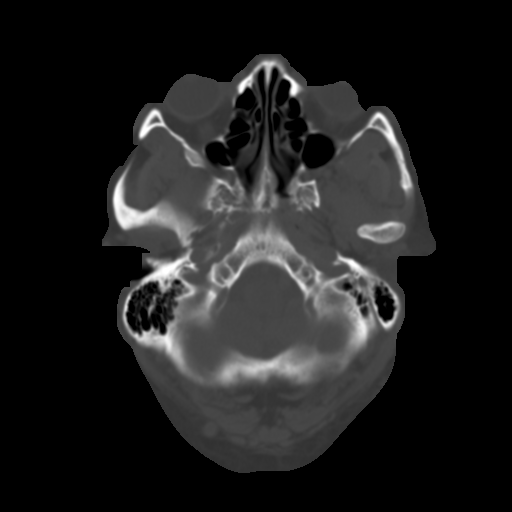
[im 8/32  brain]
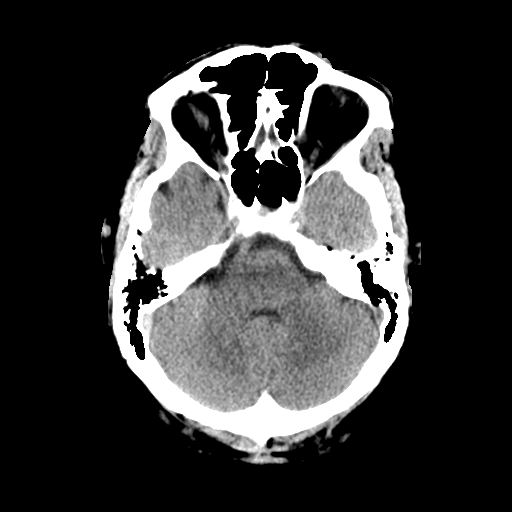
[im 12/32  brain]
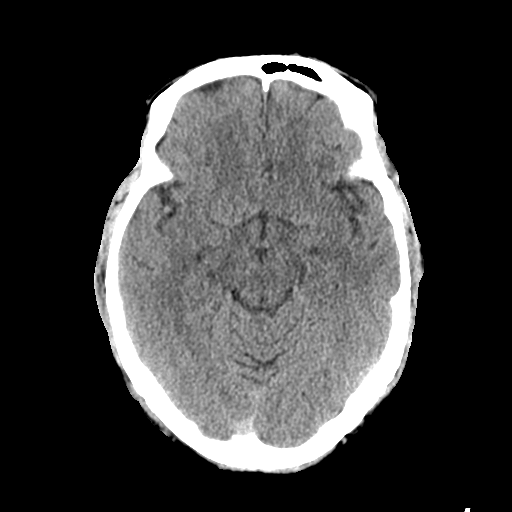
[im 16/32  brain]
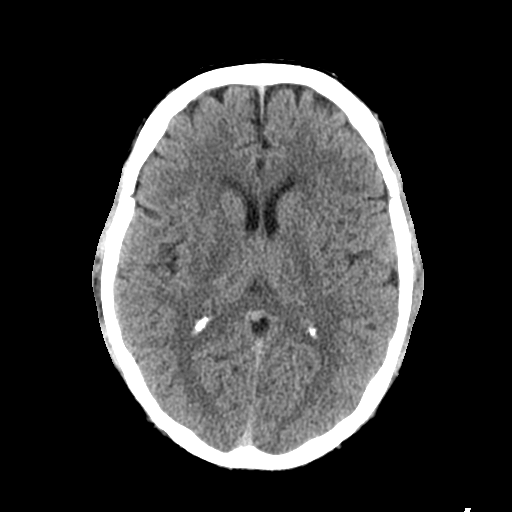
[im 20/32  brain]
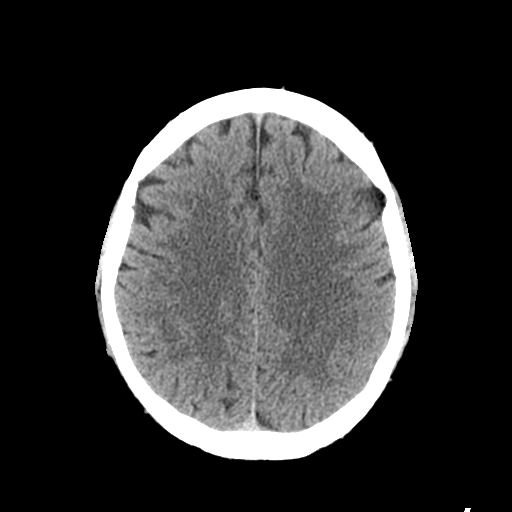
[im 20/32  bone]
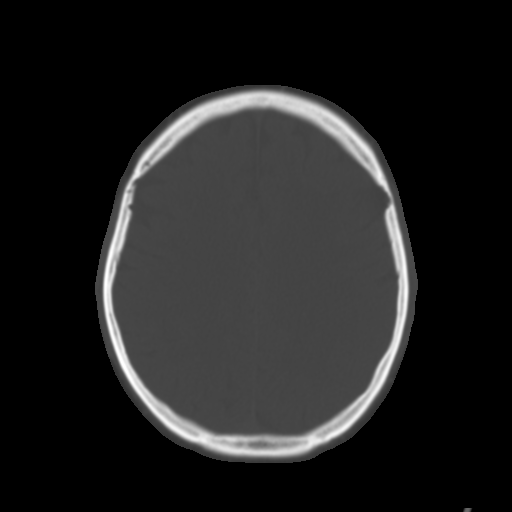
[im 24/32  brain]
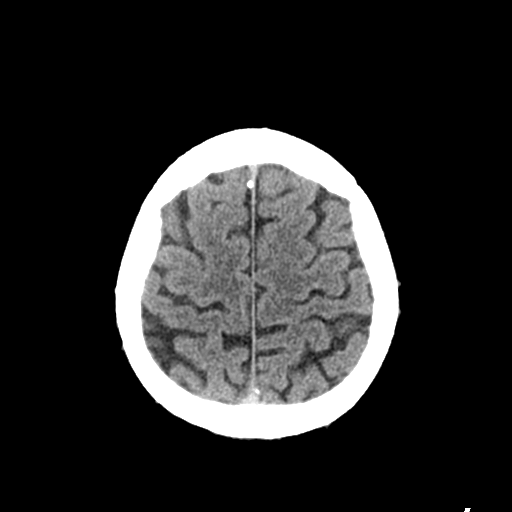
[im 28/32  brain]
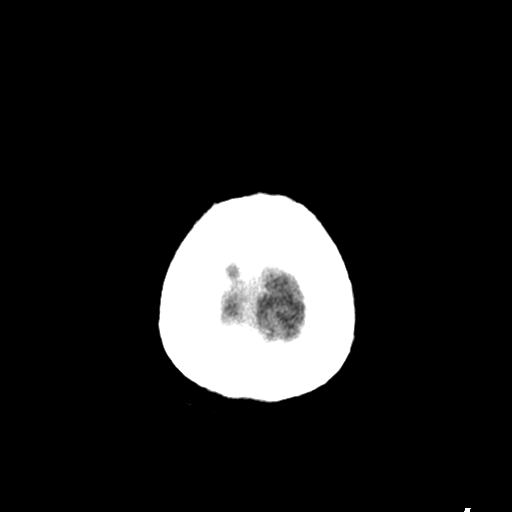

[Series 4: coronal soft tissue · coronal · 0.33mm/px · 3 of 69 slices shown]
[im 23/69  brain]
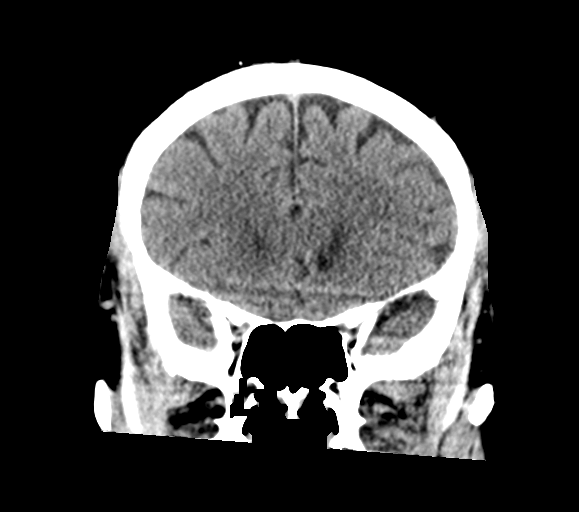
[im 31/69  brain]
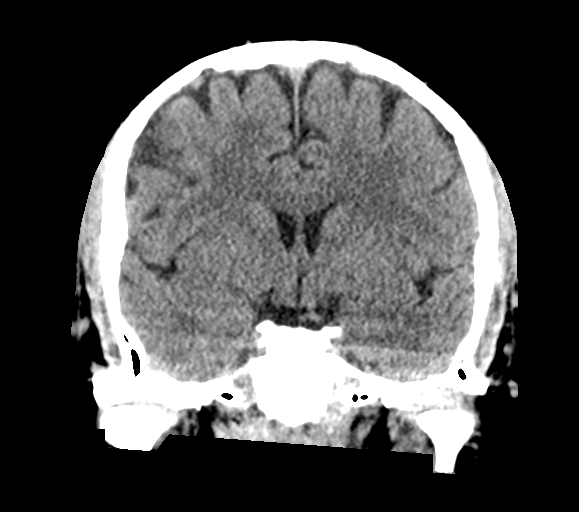
[im 38/69  brain]
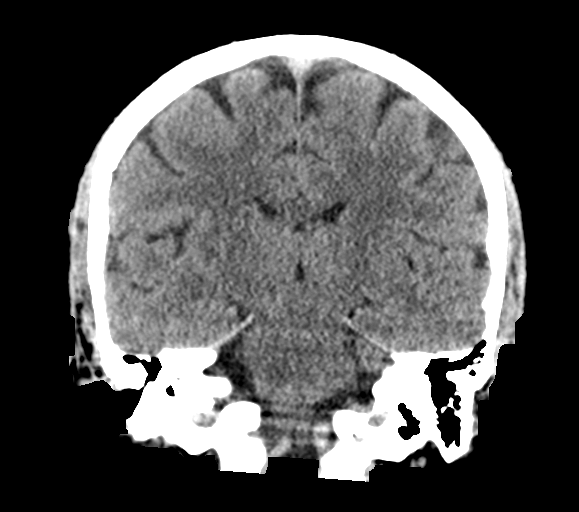

[Series 5: sagittal soft tissue · sagittal · 0.34mm/px · 3 of 56 slices shown]
[im 19/56  brain]
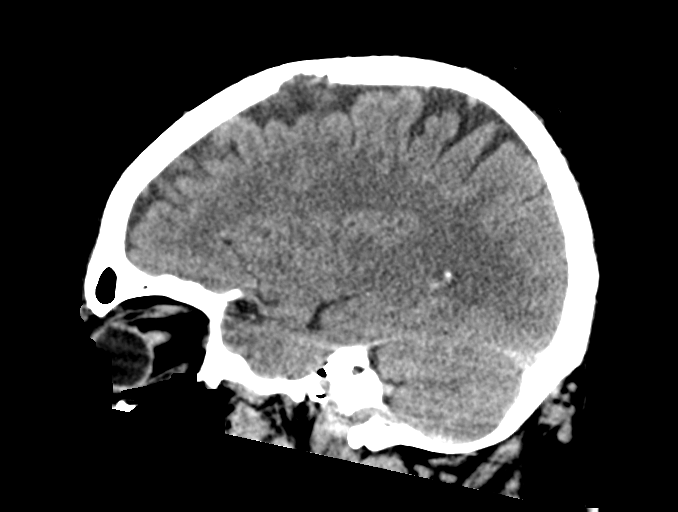
[im 28/56  brain]
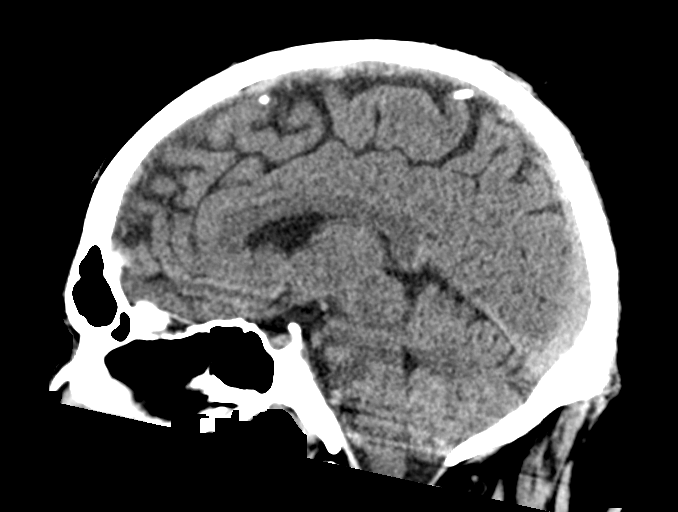
[im 37/56  brain]
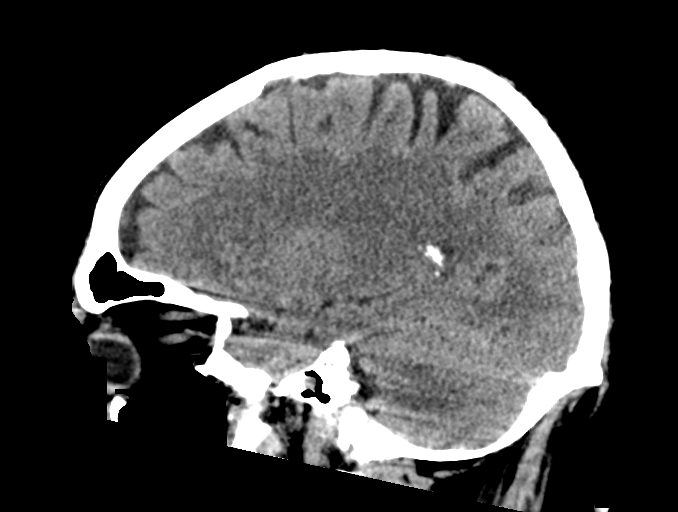

[16 of 47 positions shown; findings below may reference images not displayed]

FINDINGS: Brain: Hyperdense focus in the right lateral thalamus (series 3,
image 15), measuring approximately 5 x 8 x 8 mm, concerning for
acute hemorrhage. Mild surrounding hypodensity, likely edema. No
significant associated mass effect or midline shift. No evidence of
intraventricular extension no evidence of acute infarction or mass.
No hydrocephalus or extra-axial fluid collection.

Vascular: No hyperdense vessel.

Skull: Normal. Negative for fracture or focal lesion.

Sinuses/Orbits: No acute finding.

Other: The mastoid air cells are well aerated.
IMPRESSION: IMPRESSION
Hyperdense focus in the right lateral thalamus, concerning for acute
intraparenchymal hemorrhage.

These results were called by telephone at the time of interpretation
acknowledged these results.

## 2021-04-11 IMAGING — DX DG CHEST 1V
1 series · 1 of 1 positions shown · non-contrast
Comparison: None.

CLINICAL DATA: Stroke.

EXAM:
CHEST  1 VIEW

[chest ap]
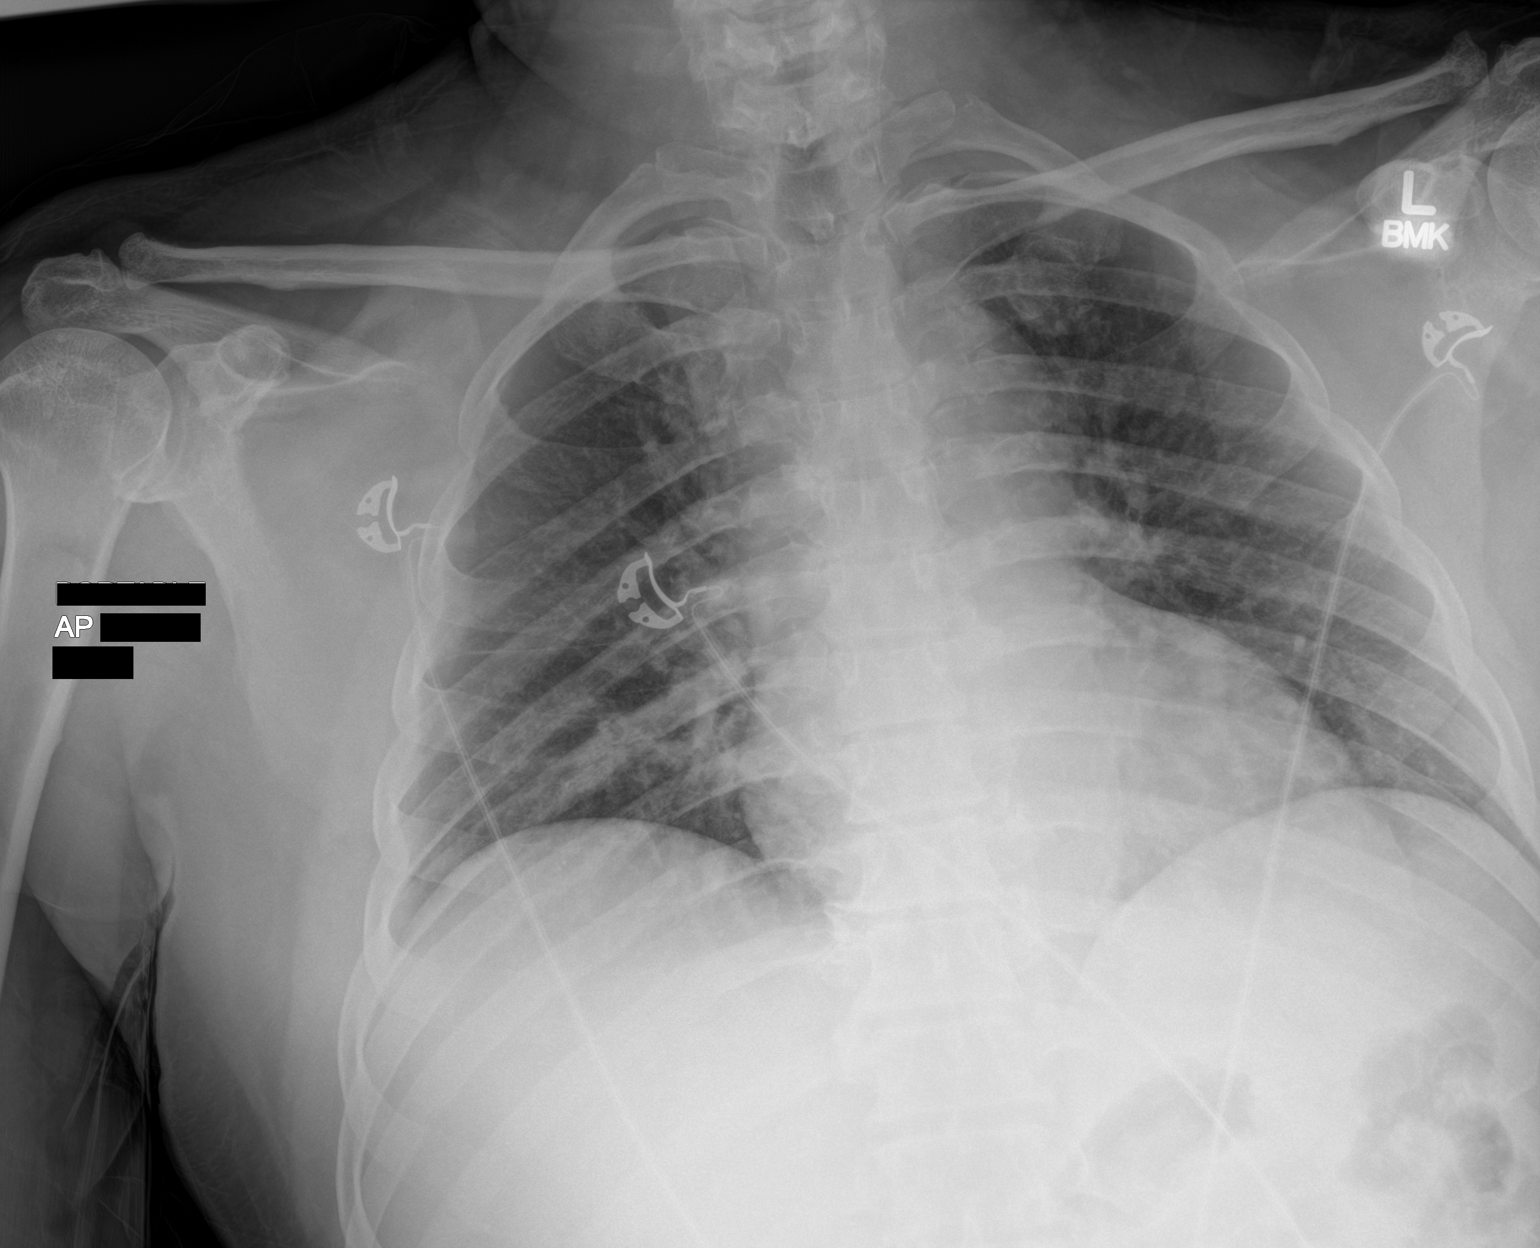

[1 of 1 positions shown; findings below may reference images not displayed]

FINDINGS: Lung volumes are low. Heart is normal in size. There is mild aortic
tortuosity. Pulmonary vasculature is normal. No consolidation,
pleural effusion, or pneumothorax. No acute osseous abnormalities
are seen.
IMPRESSION: Low lung volumes without acute abnormality.

## 2021-04-11 MED ORDER — POTASSIUM CHLORIDE 10 MEQ/100ML IV SOLN
10.0000 meq | INTRAVENOUS | Status: AC
  Administered 2021-04-11 (×3): 10 meq via INTRAVENOUS
  Filled 2021-04-11 (×3): qty 100

## 2021-04-11 MED ORDER — ACETAMINOPHEN 650 MG RE SUPP: 650.0000 mg | RECTAL | Status: DC | PRN

## 2021-04-11 MED ORDER — POTASSIUM CHLORIDE CRYS ER 20 MEQ PO TBCR
40.0000 meq | EXTENDED_RELEASE_TABLET | Freq: Once | ORAL | Status: AC
  Administered 2021-04-11: 40 meq via ORAL
  Filled 2021-04-11: qty 2

## 2021-04-11 MED ORDER — ACETAMINOPHEN 325 MG PO TABS: 650.0000 mg | ORAL_TABLET | ORAL | Status: DC | PRN

## 2021-04-11 MED ORDER — PANTOPRAZOLE SODIUM 40 MG IV SOLR
40.0000 mg | Freq: Every day | INTRAVENOUS | Status: DC
  Administered 2021-04-11: 40 mg via INTRAVENOUS
  Filled 2021-04-11: qty 40

## 2021-04-11 MED ORDER — NICARDIPINE HCL IN NACL 20-0.86 MG/200ML-% IV SOLN
3.0000 mg/h | INTRAVENOUS | Status: DC
  Administered 2021-04-11: 5 mg/h via INTRAVENOUS
  Administered 2021-04-11: 12 mg/h via INTRAVENOUS
  Administered 2021-04-11: 5 mg/h via INTRAVENOUS
  Filled 2021-04-11 (×6): qty 200

## 2021-04-11 MED ORDER — ACETAMINOPHEN 160 MG/5ML PO SOLN
650.0000 mg | ORAL | Status: DC | PRN
  Filled 2021-04-11: qty 20.3

## 2021-04-11 MED ORDER — POLYETHYLENE GLYCOL 3350 17 G PO PACK: 17.0000 g | PACK | Freq: Every day | ORAL | Status: DC | PRN

## 2021-04-11 MED ORDER — DOCUSATE SODIUM 100 MG PO CAPS: 100.0000 mg | ORAL_CAPSULE | Freq: Two times a day (BID) | ORAL | Status: DC | PRN

## 2021-04-11 MED ORDER — LABETALOL HCL 5 MG/ML IV SOLN
10.0000 mg | Freq: Once | INTRAVENOUS | Status: AC
  Administered 2021-04-11: 10 mg via INTRAVENOUS
  Filled 2021-04-11: qty 4

## 2021-04-11 MED ORDER — SENNOSIDES-DOCUSATE SODIUM 8.6-50 MG PO TABS
1.0000 | ORAL_TABLET | Freq: Two times a day (BID) | ORAL | Status: DC
  Administered 2021-04-11 – 2021-04-14 (×5): 1 via ORAL
  Filled 2021-04-11 (×5): qty 1

## 2021-04-11 MED ORDER — STROKE: EARLY STAGES OF RECOVERY BOOK: Freq: Once | Status: DC

## 2021-04-11 MED ORDER — LABETALOL HCL 5 MG/ML IV SOLN
10.0000 mg | Freq: Once | INTRAVENOUS | Status: AC
  Administered 2021-04-11: 10 mg via INTRAVENOUS

## 2021-04-11 NOTE — ED Provider Notes (Signed)
[facility] Provider Note    Event Date/Time   First MD Initiated Contact with Patient 04/11/21 1126     (approximate)   History   Numbness   HPI  Ian Moore is a 56 y.o. male who presents to the ED for evaluation of Numbness   I reviewed PCP visit from January 2022 where patient saw nurse practitioner via telemedicine.  He was recommended to continue taking his amlodipine 10 and to start HCTZ on top of this.  Patient presents to the ED, accompanied by his daughter, for evaluation of numbness in the left side of his body.  Patient reports feeling normal last night when he went to bed at about 8p. He awoke early this morning to go to work and noticed that he felt strange and numb in the left side of his body.  He went to work and the symptoms did not resolve.  Due to the sensation changes, he called his daughter who urged him to come to the hospital to get evaluated.  He denies any headache, syncopal episodes, falls or injuries.  Reports continued sensation changes and decrease in station to the left side of his body without notable weakness, gait changes.   Physical Exam   Triage Vital Signs: ED Triage Vitals [04/11/21 1039]  Enc Vitals Group     BP (!) 190/115     Pulse Rate 69     Resp 16     Temp 98.1 F (36.7 C)     Temp src      SpO2 97 %     Weight 195 lb (88.5 kg)     Height 5\' 5"  (1.651 m)     Head Circumference      Peak Flow      Pain Score 0     Pain Loc      Pain Edu?      Excl. in Ward?     Most recent vital signs: Vitals:   04/11/21 1410 04/11/21 1420  BP: 132/81 138/80  Pulse: 70 73  Resp: 19 17  Temp:    SpO2: 94% 94%    General: Awake, no distress.  Pleasant and conversational. CV:  Good peripheral perfusion.  Resp:  Normal effort.  Abd:  No distention.  MSK:  No deformity noted.  Neuro:  Cranial nerves II through XII intact.  Full strength to all 4 extremities, but he does have sensation asymmetry with decree  sensation to left-sided extremities. Other:     ED Results / Procedures / Treatments   Labs (all labs ordered are listed, but only abnormal results are displayed) Labs Reviewed  COMPREHENSIVE METABOLIC PANEL - Abnormal; Notable for the following components:      Result Value   Potassium 2.7 (*)    Glucose, Bld 155 (*)    All other components within normal limits  RESP PANEL BY RT-PCR (FLU A&B, COVID) ARPGX2  PROTIME-INR  APTT  CBC  DIFFERENTIAL  LIPID PANEL  HEMOGLOBIN A1C    EKG  Sinus rhythm, rate of 65 bpm.  Normal axis.  Left anterior fascicular block and incomplete right bundle.  No STEMI  RADIOLOGY CT head reviewed by me with right thalamic hyperintensity concerning for an IPH.  Official radiology report(s): CT HEAD WO CONTRAST  Result Date: 04/11/2021 CLINICAL DATA:  Stroke suspected, left-sided numbness EXAM: CT HEAD WITHOUT CONTRAST TECHNIQUE: Contiguous axial images were obtained from the base of the skull through the vertex without intravenous contrast. RADIATION  DOSE REDUCTION: This exam was performed according to the departmental dose-optimization program which includes automated exposure control, adjustment of the mA and/or kV according to patient size and/or use of iterative reconstruction technique. COMPARISON:  No prior CT head, correlation is made with CT orbits 01/21/2017. FINDINGS: Brain: Hyperdense focus in the right lateral thalamus (series 3, image 15), measuring approximately 5 x 8 x 8 mm, concerning for acute hemorrhage. Mild surrounding hypodensity, likely edema. No significant associated mass effect or midline shift. No evidence of intraventricular extension no evidence of acute infarction or mass. No hydrocephalus or extra-axial fluid collection. Vascular: No hyperdense vessel. Skull: Normal. Negative for fracture or focal lesion. Sinuses/Orbits: No acute finding. Other: The mastoid air cells are well aerated. IMPRESSION: IMPRESSION Hyperdense focus in the  right lateral thalamus, concerning for acute intraparenchymal hemorrhage. These results were called by telephone at the time of interpretation on 04/11/2021 at 11:10 am to provider [facility] , who verbally acknowledged these results. Electronically Signed   By: Merilyn Baba M.D.   On: 04/11/2021 11:10    PROCEDURES and INTERVENTIONS:  .1-3 Lead EKG Interpretation Performed by: Vladimir Crofts, MD Authorized by: Vladimir Crofts, MD     Interpretation: normal     ECG rate:  62   ECG rate assessment: normal     Rhythm: sinus rhythm     Ectopy: none     Conduction: normal   .Critical Care Performed by: Vladimir Crofts, MD Authorized by: Vladimir Crofts, MD   Critical care provider statement:    Critical care time (minutes):  75   Critical care time was exclusive of:  Separately billable procedures and treating other patients   Critical care was necessary to treat or prevent imminent or life-threatening deterioration of the following conditions:  CNS failure or compromise   Critical care was time spent personally by me on the following activities:  Development of treatment plan with patient or surrogate, discussions with consultants, evaluation of patient's response to treatment, examination of patient, ordering and review of laboratory studies, ordering and review of radiographic studies, ordering and performing treatments and interventions, pulse oximetry, re-evaluation of patient's condition and review of old charts  Medications  nicardipine (CARDENE) 20mg  in 0.86% saline 255ml IV infusion (0.1 mg/ml) (10 mg/hr Intravenous Rate/Dose Change 04/11/21 1249)  labetalol (NORMODYNE) injection 10 mg (10 mg Intravenous Given 04/11/21 1157)  labetalol (NORMODYNE) injection 10 mg (10 mg Intravenous Given 04/11/21 1216)  potassium chloride SA (KLOR-CON M) CR tablet 40 mEq (40 mEq Oral Given 04/11/21 1234)     IMPRESSION / MDM / [place] / ED COURSE  I reviewed the triage vital signs and the nursing  notes.  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  He is hypertensive with systolics in the 465K on arrival, resistant to bolus dosing of IV labetalol and so IV nicardipine is initiated and titrated to systolic BP 354-656.  He looks clinically well without distress, signs of trauma.  Does have sensation deficit in differential on the left side, but no weakness or cranial nerve deficits that I can elicit.  Blood work with isolated hypokalemia, that we will replete orally, otherwise benign.  No hematologic derangements such as coagulopathy or anemia,, EKG without ischemic features.  CT head with a small IPH to the thalamus without midline shift or mass-effect.  There is  some back-and-forth regarding disposition, ultimately patient will require transfer to [facility] for neurochecks and monitoring in the setting of medical management of his IPH.  Clinical Course as of 04/11/21 1442  Thu Apr 11, 2021  1153 I discuss with NSGY, who defers to neurology. He does review images and offers to consult if needed [DS]  1157 I consult with Dr. Theda Sers, Neuro, who recommends BP parameters, HOB up and he will come see the patient SBP 140-160 [DS]  1222 Reassessed. BP elevating, I discussed with RN and we'll start Cardene [DS]  1257 BP improving on cardene, systolic 388I [DS]  7579 Called back to Dr. Theda Sers, he's been in a neuro staff meeting, coming now to see the patient [DS]  1348 I discuss with Dr. Mortimer Fries, he is hesitant for admission, th inking IPH needs to go to [facility] [DS]  1434 I discuss with Dr. Theda Sers who thinks patient should be fine staying here, I'll reach back out to Dr. Mortimer Fries [DS]  1441 Apparently all hypertensive IPHs should transfer to [facility], per policy. According to Dr. Leonel Ramsay [DS]    Clinical Course User Index [DS]  Vladimir Crofts, MD     FINAL CLINICAL IMPRESSION(S) / ED DIAGNOSES   Final diagnoses:  Intraparenchymal hematoma of brain, right, without loss of consciousness, initial encounter ([place])  Left sided numbness  Hypokalemia     Rx / DC Orders   ED Discharge Orders     None        Note:  This document was prepared using Dragon voice recognition software and may include unintentional dictation errors.   Vladimir Crofts, MD 04/11/21 [phone]

## 2021-04-11 NOTE — ED Notes (Signed)
Admits to decreased sensation on lateral LUE and LLE. Denies pain. Describes as "feels colder". CMS intact, sensation decreased.

## 2021-04-11 NOTE — ED Triage Notes (Signed)
Pt to ER via POV with complaints of left sided numbness from neck to feet. Reports waking up feeling badly. Denies HA, CP, SHOB, or vision issues.   Stopped taking BP meds approx 3 months ago per PCP.

## 2021-04-11 NOTE — ED Notes (Signed)
Called [facility] for transfer  1444

## 2021-04-11 NOTE — H&P (Addendum)
NAME:  Ian Moore, MRN:  [id number], DOB:  03/21/1966, LOS: 0 ADMISSION DATE:  04/11/2021, CONSULTATION DATE:  04/11/21 REFERRING MD: Harvest Dark, MD CHIEF COMPLAINT: Left-sided numbness   HPI  56 y.o with significant PMH of uncontrolled hypertension (previously treated but was recently taken off medications  x 3 months for unclear reasons), and former smoker who presented to the ED with chief complaints of left-sided weakness and numbness.  Patient report that he went to bed around 2000 last night and woke up with left sided numbness.  Denies associated altered sensorium, speech abnormality, cranial nerve deficit, seizures, focal motor deficits, diplopia, nausea or vomiting, syncope or LOC. Patient state he went to work but symptoms persistent so he called his daughter who advised him to come to the ED for further evaluation.  ED Course: On arrival to the ED, he was afebrile with blood pressure 224/114 mm Hg and pulse rate 81 beats/min. There were no focal neurological deficits; he was alert and oriented x4, and he did not demonstrate any memory deficits. Complete blood count (CBC), thyroid-stimulating hormone (TSH) -reflex, urinalysis, drug of abuse screen were unremarkable other than comprehensive metabolic panel (CMP) which showed hypokalemia and elevated blood glucose levels of 155; an insidious infectious workup was initiated, including  SARS-CoV-2 PCR and Influenza PCR that were ultimately negative. ECG showed sinus rhythm of 51 beats per minute and the chest X-ray was normal. A non-contrast head CT showed an acute right thalamic intraparenchymal hemorrhage with mild surrounding edema. Patient was evaluated by in house Neurologist at the bedside. Initial NIHSS: 1, ICH score: 0. PCCM consulted for admission to ICU for close monitoring.  Past Medical History  Hypertension Former smoker  [place] [facility] Events   1/12: Admitted to the ICU with acute right thalamic hemorrhagic  stroke  Consults:  Neurology  Procedures:  None  Significant Diagnostic Tests:  04/11/21: Noncontrast CT head>   Micro Data:  04/11/21: SARS-CoV-2 PCR> negative 04/11/21: Influenza PCR> negative  Antimicrobials:  None   OBJECTIVE  Blood pressure (!) 141/83, pulse 67, temperature 98.1 F (36.7 C), resp. rate 17, height 5\' 5"  (1.651 m), weight 88.5 kg, SpO2 96 %.       No intake or output data in the 24 hours ending 04/11/21 2201 Filed Weights   04/11/21 1039  Weight: 88.5 kg     Physical Examination  GENERAL: 56 year-old critically ill patient lying in the bed with no acute distress.  EYES: Pupils equal, round, reactive to light and accommodation. No scleral icterus. Extraocular muscles intact.  HEENT: Head atraumatic, normocephalic. Oropharynx and nasopharynx clear.  NECK:  Supple, no jugular venous distention. No thyroid enlargement, no tenderness.  LUNGS: Normal breath sounds bilaterally, no wheezing, rales,rhonchi or crepitation. No use of accessory muscles of respiration.  CARDIOVASCULAR: S1, S2 normal. No murmurs, rubs, or gallops.  ABDOMEN: Soft, nontender, nondistended. Bowel sounds present. No organomegaly or mass.  EXTREMITIES: No pedal edema, cyanosis, or clubbing.  NEUROLOGIC: Cranial nerves II through XII are intact.  Muscle strength 5/5 in all extremities. Sensation decreased on the left. Gait not checked.  PSYCHIATRIC: The patient is alert and oriented x 3.  SKIN: No obvious rash, lesion, or ulcer.   Labs/imaging that I havepersonally reviewed  (right click and "Reselect all SmartList Selections" daily)     Labs   CBC: Recent Labs  Lab 04/11/21 1041  WBC 8.0  NEUTROABS 5.5  HGB 14.7  HCT 42.1  MCV 83.5  PLT 184  Basic Metabolic Panel: Recent Labs  Lab 04/11/21 1041  NA 136  K 2.7*  CL 102  CO2 23  GLUCOSE 155*  BUN 17  CREATININE 0.95  CALCIUM 8.9   GFR: Estimated Creatinine Clearance: 88.8 mL/min (by C-G formula based on SCr of  0.95 mg/dL). Recent Labs  Lab 04/11/21 1041  WBC 8.0    Liver Function Tests: Recent Labs  Lab 04/11/21 1041  AST 33  ALT 37  ALKPHOS 49  BILITOT 0.9  PROT 8.0  ALBUMIN 3.9   No results for input(s): LIPASE, AMYLASE in the last 168 hours. No results for input(s): AMMONIA in the last 168 hours.  ABG No results found for: PHART, PCO2ART, PO2ART, HCO3, TCO2, ACIDBASEDEF, O2SAT   Coagulation Profile: Recent Labs  Lab 04/11/21 1041  INR 1.1    Cardiac Enzymes: No results for input(s): CKTOTAL, CKMB, CKMBINDEX, TROPONINI in the last 168 hours.  HbA1C: Hgb A1c MFr Bld  Date/Time Value Ref Range Status  04/11/2021 10:41 AM 5.9 (H) 4.8 - 5.6 % Final    Comment:    (NOTE) Pre diabetes:          5.7%-6.4%  Diabetes:              >6.4%  Glycemic control for   <7.0% adults with diabetes     CBG: No results for input(s): GLUCAP in the last 168 hours.  Review of Systems:   Review of Systems  Constitutional: Negative.   HENT: Negative.    Eyes: Negative.   Respiratory: Negative.    Cardiovascular: Negative.   Gastrointestinal: Negative.   Genitourinary: Negative.   Musculoskeletal: Negative.   Skin: Negative.   Neurological:  Positive for sensory change, focal weakness and weakness.  Endo/Heme/Allergies: Negative.   Psychiatric/Behavioral: Negative.      Past Medical History  He,  has a past medical history of Hypertension.   Surgical History   History reviewed. No pertinent surgical history.   Social History   reports that he has quit smoking. He has never used smokeless tobacco. He reports that he does not drink alcohol and does not use drugs.   Family History   His family history is not on file.   Allergies Allergies  Allergen Reactions   Influenza A (H1n1) Monoval Vac Rash   Penicillins Rash    Has patient had a PCN reaction causing immediate rash, facial/tongue/throat swelling, SOB or lightheadedness with hypotension: No Has patient had a PCN  reaction causing severe rash involving mucus membranes or skin necrosis: No Has patient had a PCN reaction that required hospitalization: No Has patient had a PCN reaction occurring within the last 10 years: No If all of the above answers are "NO", then may proceed with Cephalosporin use.      Home Medications  Prior to Admission medications   Medication Sig Start Date End Date Taking? Authorizing Provider  amLODipine (NORVASC) 10 MG tablet Take 1 tablet (10 mg total) by mouth daily. Patient not taking: Reported on 04/11/2021 03/22/20   Verl Bangs, FNP  hydrochlorothiazide (HYDRODIURIL) 12.5 MG tablet Take 1 tablet (12.5 mg total) by mouth daily. Patient not taking: Reported on 04/11/2021 04/12/20   Verl Bangs, FNP    Scheduled Meds:   stroke: mapping our early stages of recovery book   Does not apply Once   pantoprazole (PROTONIX) IV  40 mg Intravenous QHS   senna-docusate  1 tablet Oral BID   Continuous Infusions:  niCARDipine 5 mg/hr (04/11/21  1727)   potassium chloride 10 mEq (04/11/21 2213)   PRN Meds:.acetaminophen **OR** acetaminophen (TYLENOL) oral liquid 160 mg/5 mL **OR** acetaminophen, docusate sodium, polyethylene glycol  Assessment & Plan:   Right Thalamic Stroke  Etiology small vessel disease in the setting of uncontrolled Hypertension PMHx: Hypertension -Continue Nicardipine gtt for goal SBP<140, PRN Labetalol -Obtain MRI/MRA brain without contrast per stroke protocol -Obtain TTE to r/o cardioembolic source, assess EF  -Order Carotid dopplers -Check fasting Lipid Panel with Direct LDL, HgA1c, TSH  -HOB >= 30 degrees with aspiration precautions -INR goal <1.4, daily coags.  -No HepSQ, antiplatelet or anticoagulant agents at this time -Place SCDs for DVT prevention. -NPO for now until passes swallow screen  -PT consult, OT consult, Speech consult -Aspiration precautions, Bleeding precautions -Obtain STAT head CT for any new acute headache or new  neurological deficits -Neurology consult, appreciate input.   Hypokalemia -Monitor I&O's / urinary output -Follow BMP -Replace electrolytes as indicated   Hyperglycemia without Diagnosis of DM -Check HgA1c -CBGs -Sliding scale insulin -Follow ICU hyper/hypoglycemia protocol   Best practice:  Diet:  NPO Pain/Anxiety/Delirium protocol (if indicated): No VAP protocol (if indicated): Not indicated DVT prophylaxis: Contraindicated GI prophylaxis: PPI Glucose control:  SSI No Central venous access:  N/A Arterial line:  N/A Foley:  N/A Mobility:  bed rest  PT consulted: Yes Last date of multidisciplinary goals of care discussion [1/12] Code Status:  full code Disposition: ICU   = Goals of Care = Code Status Order: FULL  Primary Emergency Contact: [place] Wishes to pursue full aggressive treatment and intervention options, including CPR and intubation, but goals of care will be addressed on going with family if that should become necessary.  Critical care time: 45 minutes     Rufina Falco, DNP, CCRN, FNP-C, AGACNP-BC Acute Care Nurse Practitioner  [place] Pager: [phone] [place] at [facility]  .

## 2021-04-11 NOTE — ED Notes (Signed)
Called [facility] spoke with Ian Moore still waiting on bed assignment

## 2021-04-11 NOTE — ED Provider Notes (Addendum)
----------------------------------------- °  3:31 PM on 04/11/2021 ----------------------------------------- I have spoken to Dr. Cheral Marker of neurology at [facility] who is excepted the patient to his service.  Currently awaiting ICU bed assignment at [facility].  Patient remains awake alert, no distress.  Blood pressure currently 935 systolic we will continue to titrate Cardene to bring the systolic to goal between 701 and 150.  Patient remains awake alert no distress.   Harvest Dark, MD 04/11/21 1531  ----------------------------------------- 3:48 PM on 04/11/2021 ----------------------------------------- I personally seen and evaluated the patient.  He states mild numbness to his left thigh but otherwise feels normal.  We will repeat a CT scan at 6 hours to check for any increasing size of the bleed.  I spoke to Dr. Cheral Marker at neurology at [facility] who is excepted the patient currently awaiting ICU bed availability.  Blood pressure currently 137/80 while I was in the room.  Patient appears quite well.  We will continue to closely monitor until an ICU bed is available for the patient.    Harvest Dark, MD 04/11/21 1549  ----------------------------------------- 7:50 PM on 04/11/2021 ----------------------------------------- Dr. Theda Sers has seen the patient once again, repeat head CT shows no change in the bleeding.  He spoke to Dr. Cheral Marker and per report does not have an ICU bed available for the patient and asked if we could admit locally.  I spoke to our intensivist who states that due to the new policy we would need special approval from Dr. Leonel Ramsay Director of neurology.  I spoke to Dr. Leonel Ramsay he states given these stability of the bleed as it is unlikely to require any neuro surgical procedure he believes the patient would be safe for admission locally to our ICU.  Spoke to the intensivist who agrees and will admit to the ICU.    Harvest Dark,  MD 04/11/21 [phone]

## 2021-04-11 NOTE — ED Notes (Addendum)
No changes. Alert, NAD, calm, interactive, family at [facility] x3. BP improving.

## 2021-04-11 NOTE — Progress Notes (Signed)
[place] for Electrolyte Monitoring and Replacement   Recent Labs: Potassium (mmol/L)  Date Value  04/11/2021 2.7 (LL)   Calcium (mg/dL)  Date Value  04/11/2021 8.9   Albumin (g/dL)  Date Value  04/11/2021 3.9  01/12/2017 4.6   Phosphorus (mg/dL)  Date Value  01/12/2017 3.8   Sodium (mmol/L)  Date Value  04/11/2021 136  01/12/2017 143     Assessment: 56 y.o. male who presents to the ED for evaluation of numbness. He is being admitted to the CCU. Pharmacy is asked to follow and replace electrolytes while in the CCU.  Goal of Therapy:  Electrolytes WNL  Plan:  ED provider ordered 40 mEq oral KCl x 1 Will order 10 mEq IV KCl x 3 Recheck electrolytes in am  Dallie Piles ,PharmD Clinical Pharmacist 04/11/2021 8:13 PM

## 2021-04-11 NOTE — ED Notes (Signed)
Pt. Accepted to [facility] waiting for bed Assignment

## 2021-04-11 NOTE — ED Notes (Signed)
EDP at BS 

## 2021-04-11 NOTE — ED Notes (Signed)
Called [facility] and spoke with Minette Brine still waiting on bed assignment 20:01

## 2021-04-11 NOTE — Progress Notes (Signed)
Patient was accepted as a transfer to [facility] under neurology service for further evaluation of right thalamic hemorrhagic stroke per in-house neurologist recommendations.  PCCM was initially contacted by EDP for admission. However due to new policy the patient was not accepted for admission.  Patient was reevaluated by in-house neurology Dr. Theda Sers with recommendation for repeat CT head which showed no change or evolution of the bleed.  Case discussed with [facility] neurologist Dr. Cheral Marker and per report there were no beds currently available at [facility].  PCCM was again contacted by EDP to admit to the ICU.  I spoke to ICU director Dr. Mortimer Fries who recommended that the neurology director Dr. Leonel Ramsay be contracted for his input.  Dr. Katherine Roan was contacted by EDP and per his recommendations,  given the stability of the bleed and unlikely to require any neurosurgical procedure he believe the patient would be safe for admission to  [facility] ICU. Dr Mortimer Fries was informed who accepted patient to [facility] service.   Rufina Falco, DNP, CCRN, FNP-C, AGACNP-BC Acute Care Nurse Practitioner  [place] [facility] Pager: [phone] [place] at [facility]

## 2021-04-11 NOTE — ED Notes (Addendum)
Up to BS to void, no changes, alert, NAD, calm, interactive. Family at [facility]. MD at [facility] for update. Pending bed assignment at [facility].

## 2021-04-11 NOTE — Consult Note (Signed)
Neurology H&P  Ian Moore MR# [id number] 04/11/2021  CC: left numbness  History is obtained from: Patient, daughters, mother.  HPI: Ian Moore is a 56 y.o. male went to bed normal last night ~2000 and woke early for work and noticed numb feeling in his left shoulder and upper extremity as well as left lower extremity. He went to work but symptoms persisted and he called his daughter  who urged him to proceed to ED for further evaluation.  He denies headache, dizziness, syncope, falls or injuries, weakness.   He previously was treated for HTN but was recently taken off medications for unclear reasons - Daughter stated resolved.  LKW: 2000 tpa given?: No, ICH IR Thrombectomy? No, ICH Modified Rankin Scale: 0-Completely asymptomatic and back to baseline post- stroke NIHSS: 1 ICH score 0   ROS: A complete ROS was performed and is negative except as noted in the HPI.    Past Medical History:  Diagnosis Date   Hypertension    No family history on file.   Social History:  reports that he has quit smoking. He has never used smokeless tobacco. He reports that he does not drink alcohol and does not use drugs.   Prior to Admission medications   Medication Sig Start Date End Date Taking? Authorizing Provider  amLODipine (NORVASC) 10 MG tablet Take 1 tablet (10 mg total) by mouth daily. 03/22/20   Malfi, Lupita Raider, FNP  hydrochlorothiazide (HYDRODIURIL) 12.5 MG tablet Take 1 tablet (12.5 mg total) by mouth daily. 04/12/20   Verl Bangs, FNP   Exam: Current vital signs: BP (!) 191/108    Pulse 64    Temp 98.1 F (36.7 C)    Resp 18    Ht 5\' 5"  (1.651 m)    Wt 88.5 kg    SpO2 97%    BMI 32.45 kg/m   Physical Exam  Appears well-developed and well-nourished.  Psych: Affect appropriate to situation Eyes: No scleral injection HENT: No OP obstrucion Head: Normocephalic.  Cardiovascular: Normal rate and regular rhythm.  Respiratory: Effort normal and breath sounds normal to  anterior ascultation GI: Soft.  No distension. There is no tenderness.  Skin: WDI  Neuro: Mental Status: Patient is awake, alert, oriented to person, place, month, year, and situation. Patient is able to give a clear and coherent history. Speech is fluent, intact comprehension and repetition. No signs of aphasia or neglect. Visual Fields are full. Pupils are equal, round, and reactive to light. EOMI without ptosis or diploplia.  Facial sensation is symmetric to temperature Facial movement is symmetric.  Hearing is intact to voice. Uvula midline, palate elevates symmetrically. Shoulder shrug is symmetric. Tongue is midline without atrophy or fasciculations.  Tone is normal. Bulk is normal. 5/5 strength was present in all four extremities. Sensation is mildly decreased in left upper extremity and left thigh.  Deep Tendon Reflexes: 2+ and symmetric in the biceps and patellae. Toes are downgoing bilaterally. FNF and HKS are intact bilaterally. Gait - Deferred  I have reviewed labs in epic and the pertinent results are: No relevant labs available.  I have reviewed the images obtained: Hyperdense focus in the right lateral thalamus with perilesional edema without mass effect.  Assessment: Ian Moore is a 56 y.o. male PMHx noted above with hypertensive hemorrhage in ventrobasal complex of right thalamus and residual interruption of sensory afferent of LUE>>>LLE.  His daughter adds he snores heavily and is constantly tired and sleepy.  Impression:  Hypertensive emergency. Hemorrhagic stroke  in right ventrobasal thalamus. NIHSS 1 ICH score 0 Left extremity numbness. HTN   Plan:  Elevate head of bed keep head midline. Blood pressure goal MAP >65 SBP <140.   - Start nicardipine infusion may increase to maximum 64mcg/min as needed to maintain SBP<140.   - Labetalol 20mg  as needed if SBP>140. X-ray chest STAT. Maintain O2 sats > 94%.  Transfer to [facility] for ICU  admission.   This patient is critically ill and at significant risk of neurological worsening, death and care requires constant monitoring of vital signs, hemodynamics,respiratory and cardiac monitoring, neurological assessment, discussion with family, other specialists and medical decision making of high complexity. I spent 73 minutes of neurocritical care time  in the care of  this patient. This was time spent independent of any time provided by nurse practitioner or PA.  Electronically signed by:  Lynnae Sandhoff, MD Page: [phone] 04/11/2021, 11:59 AM

## 2021-04-12 ENCOUNTER — Inpatient Hospital Stay
Admit: 2021-04-12 | Discharge: 2021-04-12 | Disposition: A | Discharge status: Home / Self Care | Payer: Commercial Managed Care - PPO | Attending: Nurse Practitioner | Admitting: Nurse Practitioner

## 2021-04-12 ENCOUNTER — Inpatient Hospital Stay: Payer: Commercial Managed Care - PPO

## 2021-04-12 DIAGNOSIS — I610 Nontraumatic intracerebral hemorrhage in hemisphere, subcortical: Secondary | ICD-10-CM | POA: Diagnosis not present

## 2021-04-12 DIAGNOSIS — I669 Occlusion and stenosis of unspecified cerebral artery: Secondary | ICD-10-CM

## 2021-04-12 DIAGNOSIS — I6930 Unspecified sequelae of cerebral infarction: Secondary | ICD-10-CM

## 2021-04-12 LAB — RENAL FUNCTION PANEL
Albumin: 3.9 g/dL (ref 3.5–5.0)
Anion gap: 8 (ref 5–15)
BUN: 11 mg/dL (ref 6–20)
CO2: 25 mmol/L (ref 22–32)
Calcium: 8.3 mg/dL — ABNORMAL LOW (ref 8.9–10.3)
Chloride: 104 mmol/L (ref 98–111)
Creatinine, Ser: 0.66 mg/dL (ref 0.61–1.24)
GFR, Estimated: >60 mL/min (ref >60)
Glucose, Bld: 120 mg/dL — ABNORMAL HIGH (ref 70–99)
Phosphorus: 2.1 mg/dL — ABNORMAL LOW (ref 2.5–4.6)
Potassium: 3 mmol/L — ABNORMAL LOW (ref 3.5–5.1)
Sodium: 137 mmol/L (ref 135–145)

## 2021-04-12 LAB — ECHOCARDIOGRAM COMPLETE
AR max vel: 2.61 cm2
AV Area VTI: 2.58 cm2
AV Area mean vel: 2.55 cm2
AV Mean grad: 7 mmHg
AV Peak grad: 11.2 mmHg
Ao pk vel: 1.67 m/s
Area-P 1/2: 2.86 cm2
Height: 65 in
MV VTI: 2.74 cm2
S' Lateral: 3.82 cm
Weight: 3120 oz

## 2021-04-12 LAB — MAGNESIUM: Magnesium: 2.2 mg/dL (ref 1.7–2.4)

## 2021-04-12 LAB — BASIC METABOLIC PANEL
Anion gap: 6 (ref 5–15)
BUN: 11 mg/dL (ref 6–20)
CO2: 26 mmol/L (ref 22–32)
Calcium: 8.5 mg/dL — ABNORMAL LOW (ref 8.9–10.3)
Chloride: 103 mmol/L (ref 98–111)
Creatinine, Ser: 0.71 mg/dL (ref 0.61–1.24)
GFR, Estimated: >60 mL/min (ref >60)
Glucose, Bld: 120 mg/dL — ABNORMAL HIGH (ref 70–99)
Potassium: 3.4 mmol/L — ABNORMAL LOW (ref 3.5–5.1)
Sodium: 135 mmol/L (ref 135–145)

## 2021-04-12 LAB — CBC
HCT: 42.9 % (ref 39.0–52.0)
Hemoglobin: 15.1 g/dL (ref 13.0–17.0)
MCH: 29 pg (ref 26.0–34.0)
MCHC: 35.2 g/dL (ref 30.0–36.0)
MCV: 82.5 fL (ref 80.0–100.0)
Platelets: 207 10*3/uL (ref 150–400)
RBC: 5.2 MIL/uL (ref 4.22–5.81)
RDW: 12.6 % (ref 11.5–15.5)
WBC: 9.6 10*3/uL (ref 4.0–10.5)
nRBC: 0 % (ref 0.0–0.2)

## 2021-04-12 LAB — HIV ANTIBODY (ROUTINE TESTING W REFLEX): HIV Screen 4th Generation wRfx: NONREACTIVE

## 2021-04-12 IMAGING — MR MR MRA HEAD W/O CM
1 series · 16 of 48 positions shown · non-contrast
Comparison: Prior CT from [DATE].

CLINICAL DATA: Follow-up examination for acute stroke.



[Series 5: TOF · axial · 0.5mm · 0.41mm/px · z∈[-37,+57]mm · 16 of 205 slices shown]
[im 1/205]
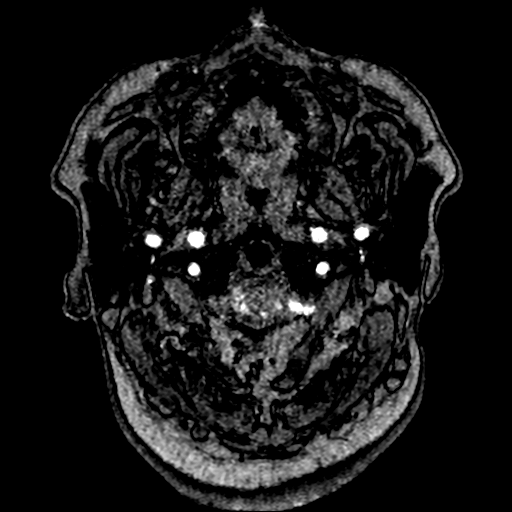
[im 5/205]
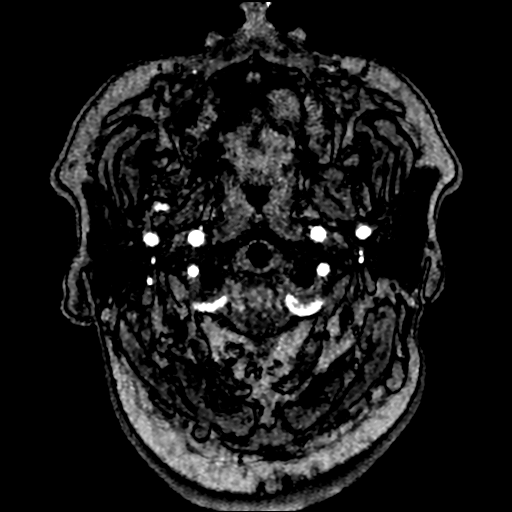
[im 9/205]
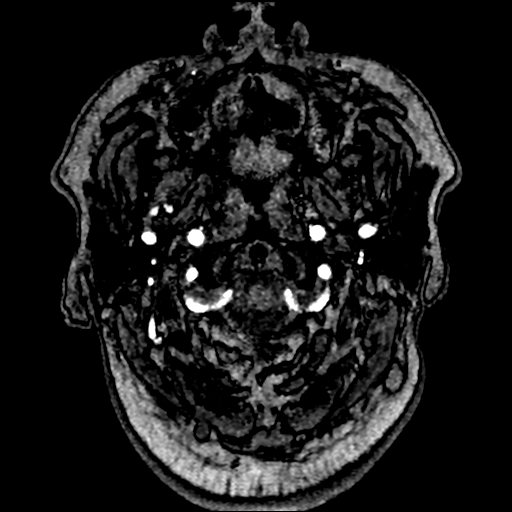
[im 14/205]
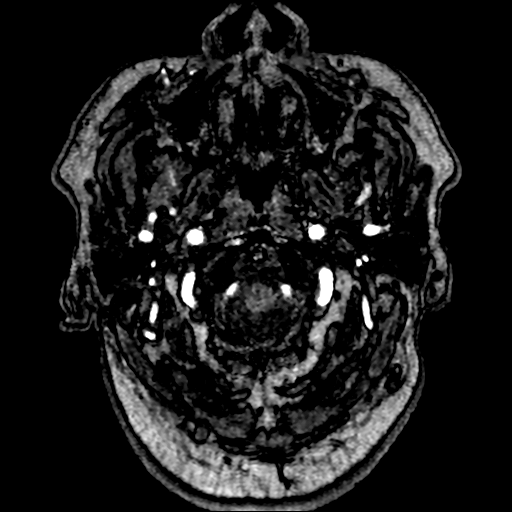
[im 18/205]
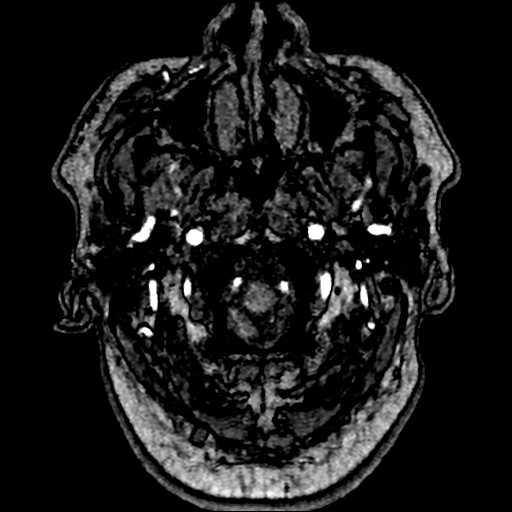
[im 22/205]
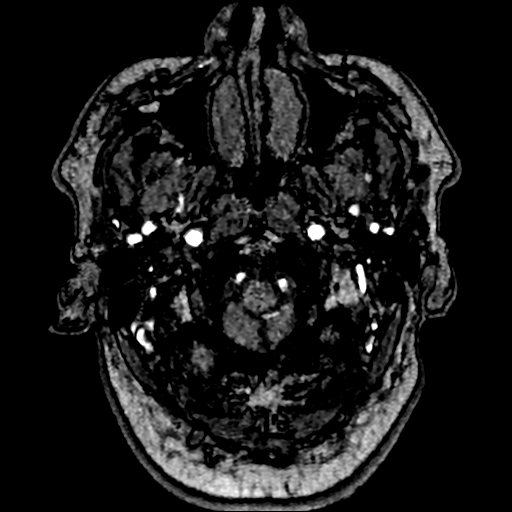
[im 35/205]
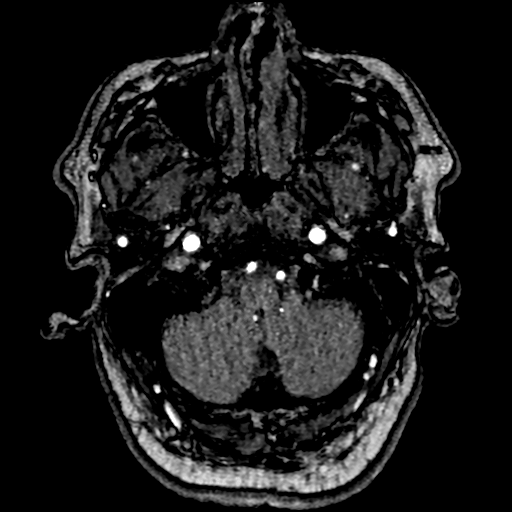
[im 40/205]
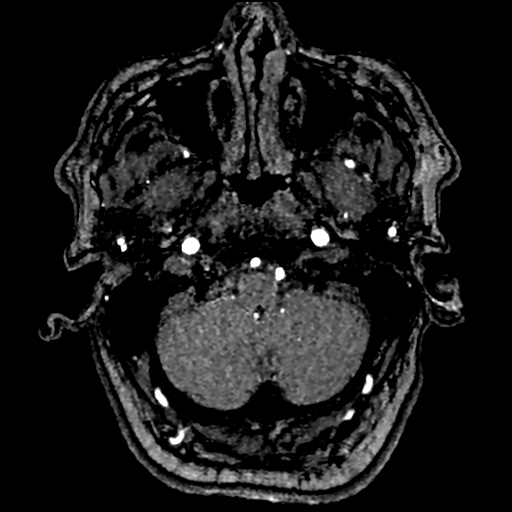
[im 66/205]
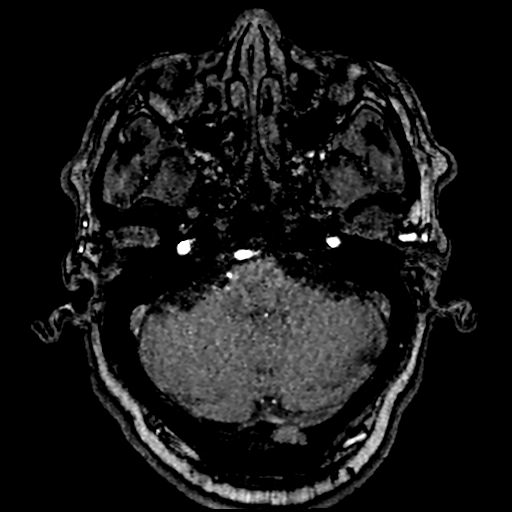
[im 92/205]
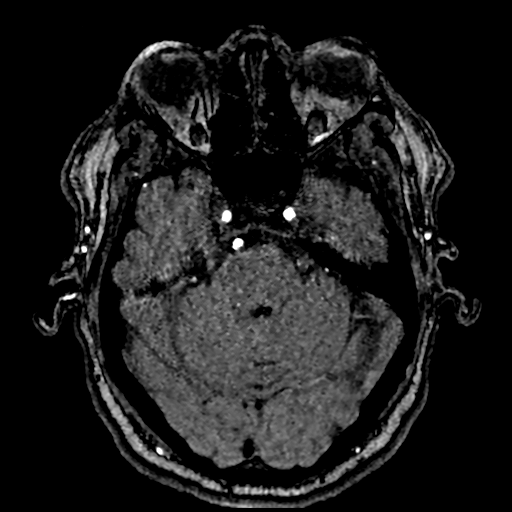
[im 105/205]
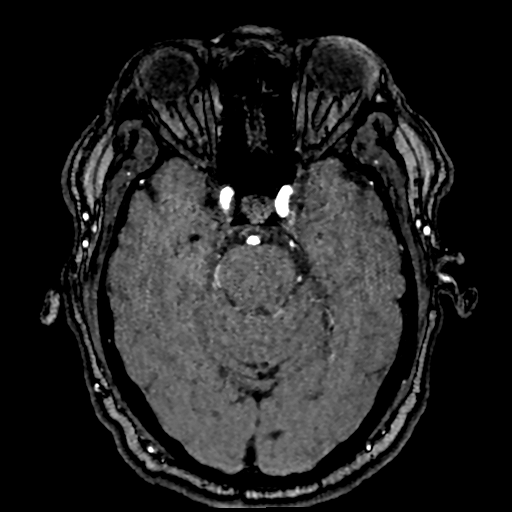
[im 118/205]
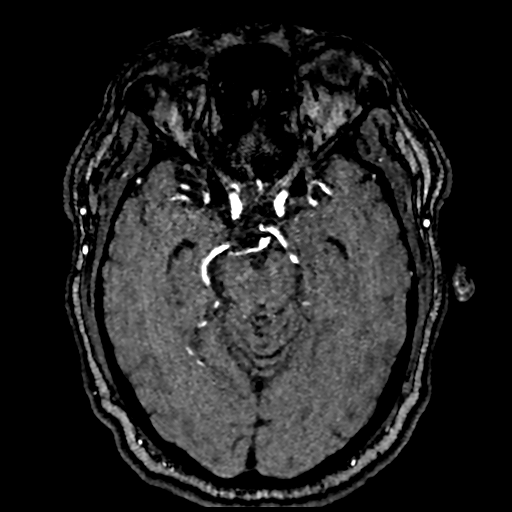
[im 144/205]
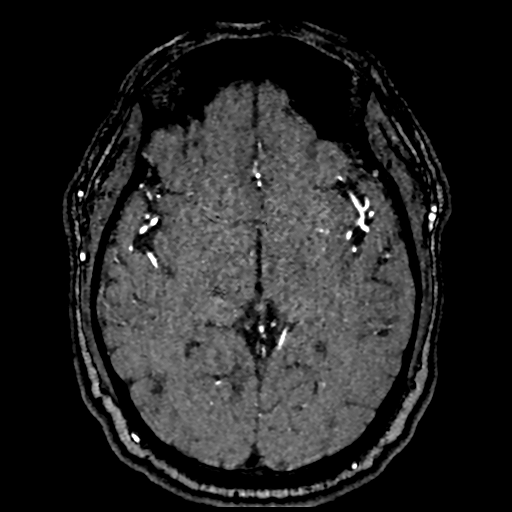
[im 170/205]
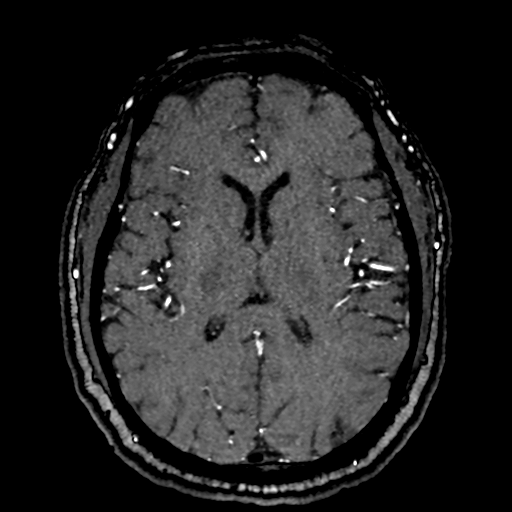
[im 174/205]
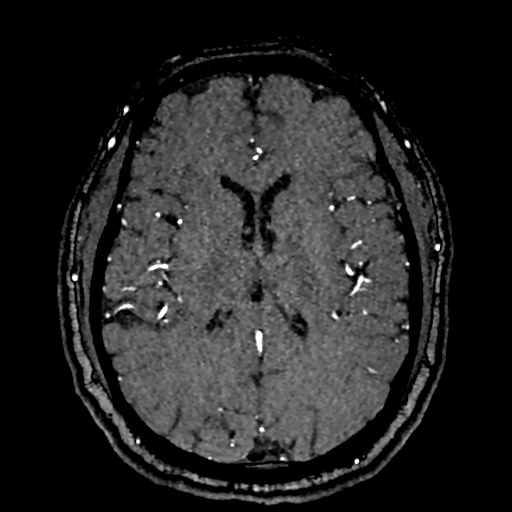
[im 196/205]
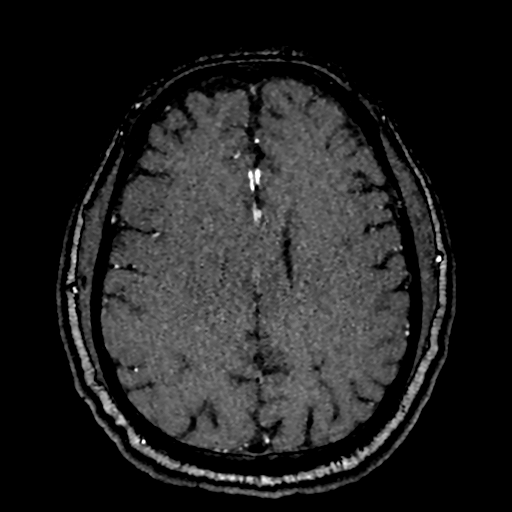

[16 of 48 positions shown; findings below may reference images not displayed]

FINDINGS: MRI HEAD FINDINGS

Brain: Cerebral volume within normal limits. Patchy T2/FLAIR
hyperintensity involving the periventricular deep white matter both
cerebral hemispheres most consistent with chronic small vessel
ischemic disease, mild in nature. Few small remote lacunar infarcts
present at the left anterior corpus callosum, bilateral basal
ganglia, left thalamus, and pons.

Previously identified acute intraparenchymal hemorrhage centered at
the right thalamus again seen, not significantly changed in size
measuring up to 9 mm. Minimal surrounding vasogenic edema without
significant regional mass effect. No intraventricular extension.
This likely reflects a hemorrhagic stroke, suspected to be
hypertensive in nature. No other acute intracranial hemorrhage
elsewhere within the brain. Few additional chronic micro hemorrhages
noted about the cerebellum and thalami, most like related to chronic
poorly controlled hypertension.

No mass lesion, midline shift or mass effect. No hydrocephalus or
extra-axial fluid collection. Pituitary gland suprasellar region
normal.

Vascular: Major intracranial vascular flow voids are maintained.

Skull and upper cervical spine: Craniocervical junction within
normal limits. Bone marrow signal intensity normal. No scalp soft
tissue abnormality.

Sinuses/Orbits: Globes orbital soft tissues demonstrate no acute
finding. Chronic posttraumatic defect noted at the right lamina
papyracea. Mild scattered mucosal thickening noted within the
ethmoidal air cells and maxillary sinuses. No mastoid effusion.
Inner ear structures grossly normal.

Other: None.

MRA HEAD FINDINGS

ANTERIOR CIRCULATION:

Visualized distal cervical segments of the internal carotid arteries
are patent with antegrade flow. Petrous, cavernous, and supraclinoid
segments patent without stenosis or other abnormality. A1 segments
patent bilaterally. Normal anterior communicating artery complex.
Severe atheromatous disease seen involving the right greater than
left A2/A3 segments with associated moderate to severe multifocal
stenoses (series [1J], image 12). ACAs remain patent to their distal
aspects. No M1 stenosis or occlusion. Normal MCA bifurcations.
Distal MCA branches perfused and symmetric.

POSTERIOR CIRCULATION:

Vertebral arteries are largely codominant widely patent to the
vertebrobasilar junction. Left PICA patent. Right PICA not seen.
Dominant right AICA. Basilar patent to its distal aspect without
stenosis. Superior cerebral arteries patent bilaterally. Right PCA
supplied via the basilar. Left PCA supplied via the basilar as well
as a robust left posterior communicating artery. Both PCAs widely
patent and well perfused to their distal aspects.

No intracranial aneurysm.

MRA NECK FINDINGS

AORTIC ARCH: Aortic arch and origin of the great vessels not
visualized on this exam.

RIGHT CAROTID SYSTEM: Visualized right CCA widely patent to the
bifurcation without stenosis. No significant atheromatous narrowing
about the right carotid bulb. Right ICA patent distally without
stenosis or dissection.

LEFT CAROTID SYSTEM: Visualized left CCA patent without stenosis. No
significant atheromatous irregularity or narrowing about the left
carotid bulb. Visualized left ICA patent distally without stenosis
or dissection.

VERTEBRAL ARTERIES: Visualized vertebral arteries widely patent with
antegrade flow. No evidence for dissection or stenosis.
IMPRESSION: MRI HEAD:

1. Unchanged 9 mm acute intraparenchymal hemorrhage centered at the
right thalamus. Minimal surrounding vasogenic edema without
significant regional mass effect. This likely reflects a hemorrhagic
stroke, suspected to be hypertensive in nature.
2. Few scattered chronic micro hemorrhages about the cerebellum and
thalami, likely related to chronic poorly controlled hypertension.
3. Underlying mild chronic microvascular ischemic disease with
multiple additional remote lacunar infarcts as above.

MRA HEAD:

1. Negative intracranial MRA for large vessel occlusion.
2. Severe atheromatous disease involving the right greater than left
A2/A3 segments with associated moderate to severe multifocal
stenoses.
3. Otherwise wide patency of the major intracranial arterial
vasculature. No other proximal high-grade or correctable stenosis.

MRA NECK:

Negative MRA of the neck with wide patency of both carotid artery
systems and vertebral arteries. No flow-limiting stenosis or other
acute vascular abnormality.

## 2021-04-12 IMAGING — MR MR HEAD W/O CM
11 of 12 series · 36 of 48 positions shown · non-contrast
Comparison: Prior CT from [DATE].

CLINICAL DATA: Follow-up examination for acute stroke.



[Series 5: ax dwi_tracew · axial · 3.0mm · 0.65mm/px · z∈[-31,+119]mm · 4 of 48 slices shown]
[im 1/48]
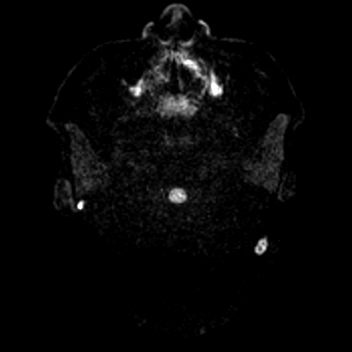
[im 16/48]
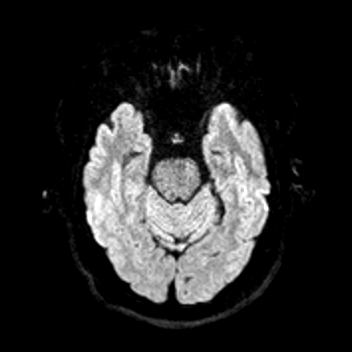
[im 32/48]
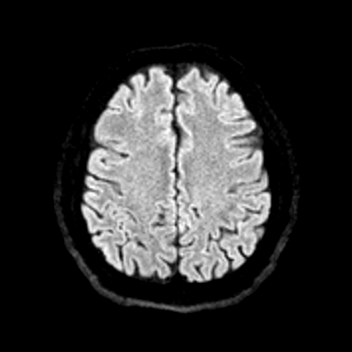
[im 48/48]
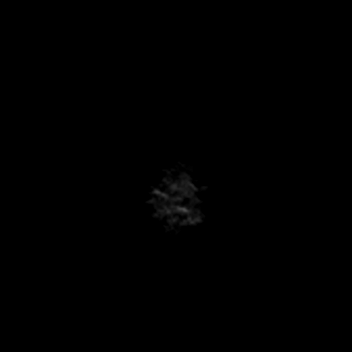

[Series 6: ax dwi_adc · axial · 3.0mm · 0.65mm/px · z∈[-31,+119]mm · 3 of 48 slices shown]
[im 1/48]
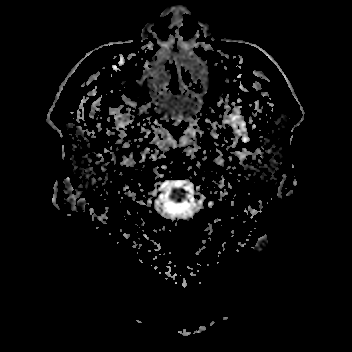
[im 24/48]
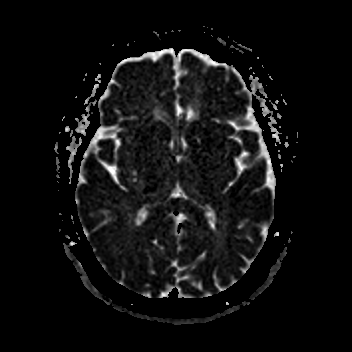
[im 48/48]
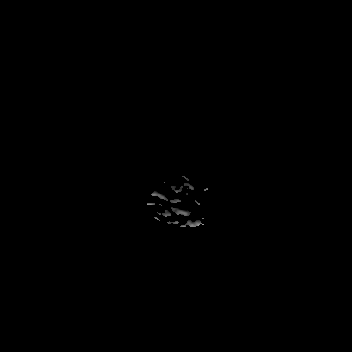

[Series 7: cor dwi_tracew · coronal · 5.0mm · 0.65mm/px · 3 of 40 slices shown]
[im 1/40]
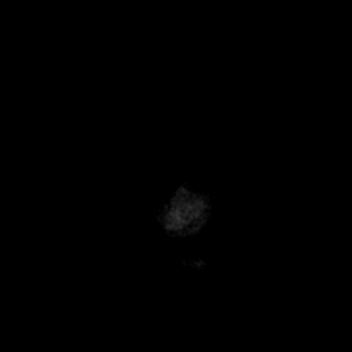
[im 20/40]
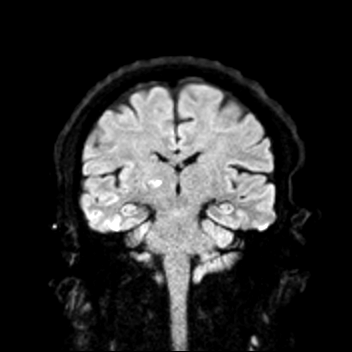
[im 40/40]
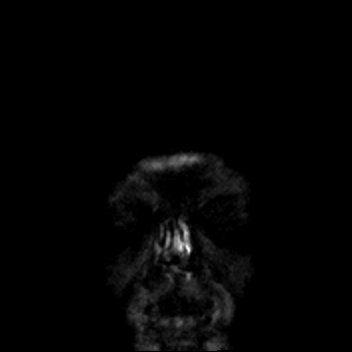

[Series 8: cor dwi_adc · coronal · 5.0mm · 0.65mm/px · 3 of 40 slices shown]
[im 1/40]
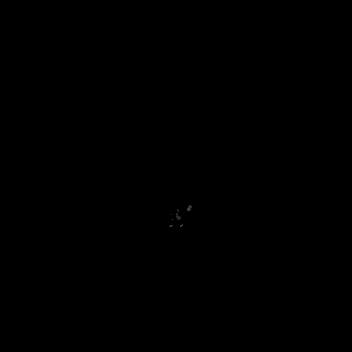
[im 20/40]
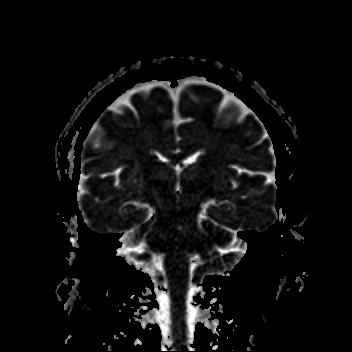
[im 40/40]
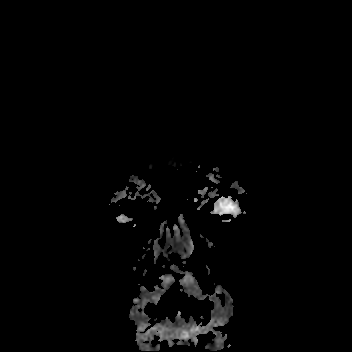

[Series 9: T1 · sagittal · 5.0mm · 0.62mm/px · 2 of 23 slices shown (1 of 2)]
[im 1/23]
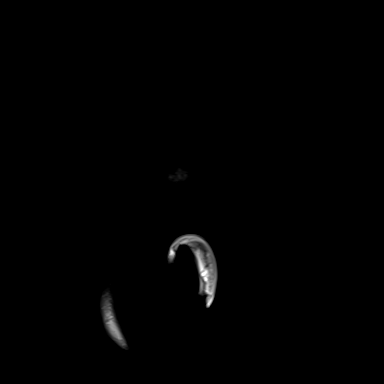
[im 23/23]
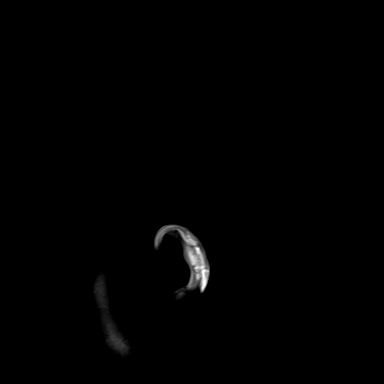

[Series 10: T2 · axial · 5.0mm · 0.53mm/px · z∈[-27,+112]mm · 2 of 25 slices shown (1 of 2)]
[im 1/25]
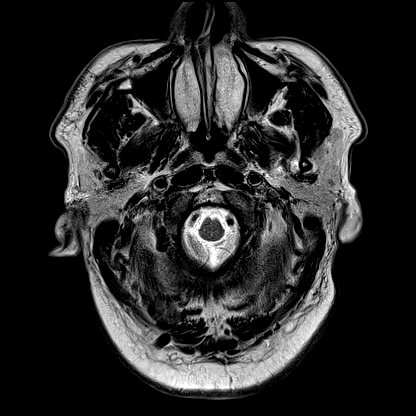
[im 25/25]
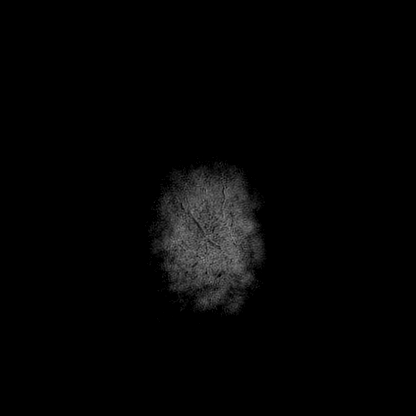

[Series 11: mag_images · axial · 3.0mm · 0.90mm/px · z∈[-41,+130]mm · 4 of 60 slices shown]
[im 1/60]
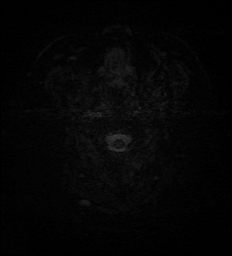
[im 20/60]
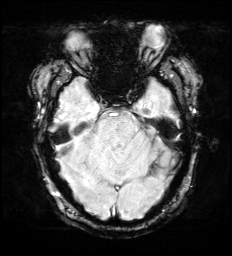
[im 40/60]
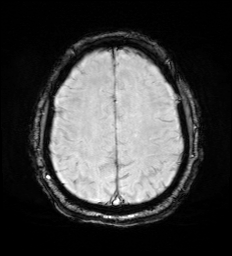
[im 60/60]
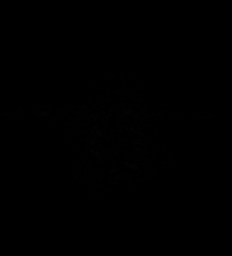

[Series 12: pha_images · axial · 3.0mm · 0.90mm/px · 1 of 57 slices shown]
[im 1/57]
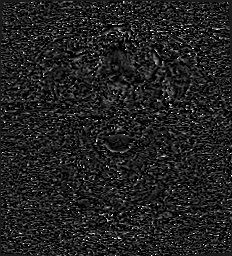

[Series 15: FLAIR · axial · 3.0mm · 0.53mm/px · z∈[-35,+121]mm · 4 of 55 slices shown]
[im 1/55]
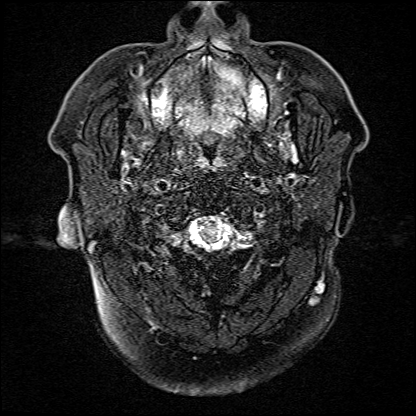
[im 19/55]
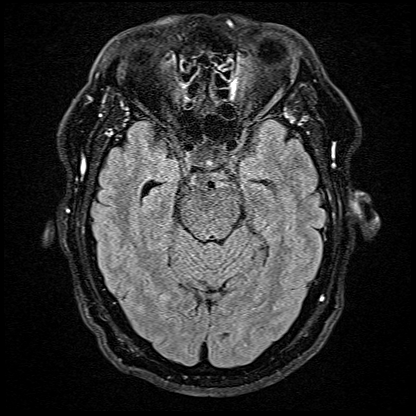
[im 37/55]
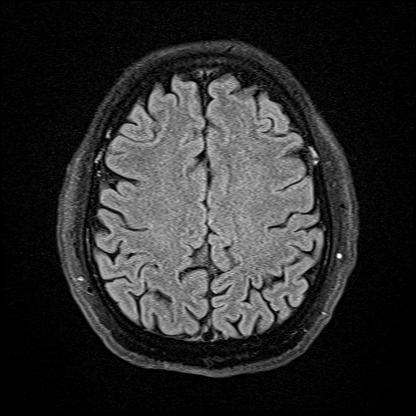
[im 55/55]
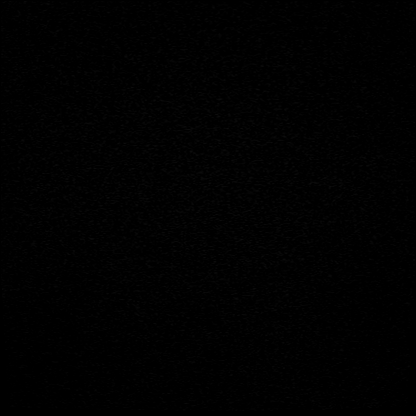

[Series 16: T1 · axial · 1.0mm · 0.98mm/px · z∈[-38,+131]mm · 8 of 176 slices shown (2 of 2)]
[im 1/176]
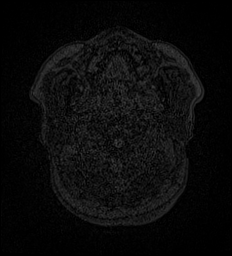
[im 30/176]
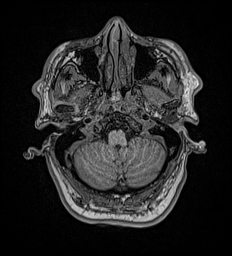
[im 59/176]
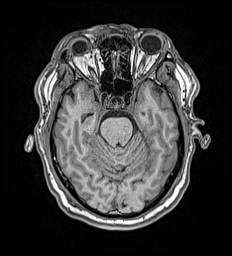
[im 73/176]
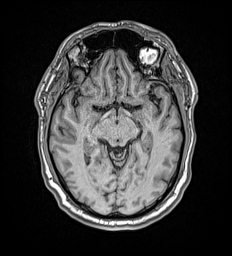
[im 103/176]
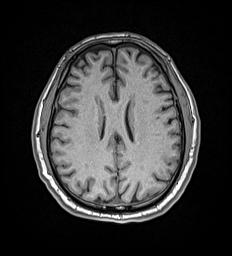
[im 117/176]
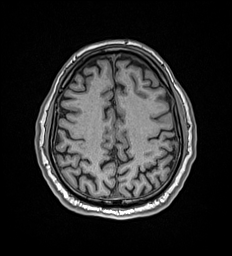
[im 146/176]
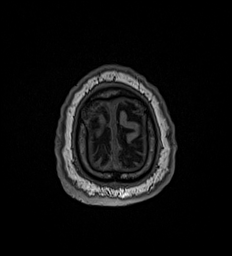
[im 176/176]
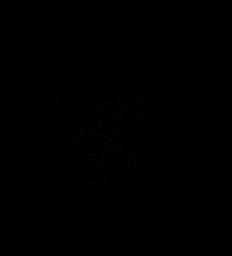

[Series 17: T2 · coronal · 5.0mm · 0.57mm/px · 2 of 29 slices shown (2 of 2)]
[im 1/29]
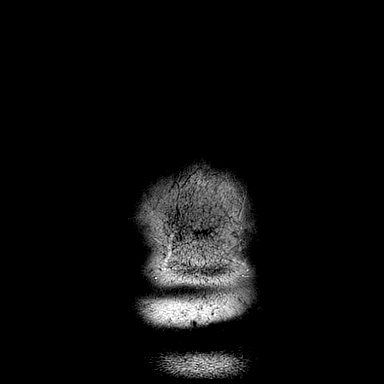
[im 29/29]
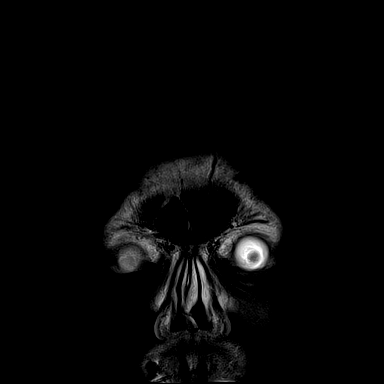

[36 of 48 positions shown; findings below may reference images not displayed]

FINDINGS: MRI HEAD FINDINGS

Brain: Cerebral volume within normal limits. Patchy T2/FLAIR
hyperintensity involving the periventricular deep white matter both
cerebral hemispheres most consistent with chronic small vessel
ischemic disease, mild in nature. Few small remote lacunar infarcts
present at the left anterior corpus callosum, bilateral basal
ganglia, left thalamus, and pons.

Previously identified acute intraparenchymal hemorrhage centered at
the right thalamus again seen, not significantly changed in size
measuring up to 9 mm. Minimal surrounding vasogenic edema without
significant regional mass effect. No intraventricular extension.
This likely reflects a hemorrhagic stroke, suspected to be
hypertensive in nature. No other acute intracranial hemorrhage
elsewhere within the brain. Few additional chronic micro hemorrhages
noted about the cerebellum and thalami, most like related to chronic
poorly controlled hypertension.

No mass lesion, midline shift or mass effect. No hydrocephalus or
extra-axial fluid collection. Pituitary gland suprasellar region
normal.

Vascular: Major intracranial vascular flow voids are maintained.

Skull and upper cervical spine: Craniocervical junction within
normal limits. Bone marrow signal intensity normal. No scalp soft
tissue abnormality.

Sinuses/Orbits: Globes orbital soft tissues demonstrate no acute
finding. Chronic posttraumatic defect noted at the right lamina
papyracea. Mild scattered mucosal thickening noted within the
ethmoidal air cells and maxillary sinuses. No mastoid effusion.
Inner ear structures grossly normal.

Other: None.

MRA HEAD FINDINGS

ANTERIOR CIRCULATION:

Visualized distal cervical segments of the internal carotid arteries
are patent with antegrade flow. Petrous, cavernous, and supraclinoid
segments patent without stenosis or other abnormality. A1 segments
patent bilaterally. Normal anterior communicating artery complex.
Severe atheromatous disease seen involving the right greater than
left A2/A3 segments with associated moderate to severe multifocal
stenoses (series [1J], image 12). ACAs remain patent to their distal
aspects. No M1 stenosis or occlusion. Normal MCA bifurcations.
Distal MCA branches perfused and symmetric.

POSTERIOR CIRCULATION:

Vertebral arteries are largely codominant widely patent to the
vertebrobasilar junction. Left PICA patent. Right PICA not seen.
Dominant right AICA. Basilar patent to its distal aspect without
stenosis. Superior cerebral arteries patent bilaterally. Right PCA
supplied via the basilar. Left PCA supplied via the basilar as well
as a robust left posterior communicating artery. Both PCAs widely
patent and well perfused to their distal aspects.

No intracranial aneurysm.

MRA NECK FINDINGS

AORTIC ARCH: Aortic arch and origin of the great vessels not
visualized on this exam.

RIGHT CAROTID SYSTEM: Visualized right CCA widely patent to the
bifurcation without stenosis. No significant atheromatous narrowing
about the right carotid bulb. Right ICA patent distally without
stenosis or dissection.

LEFT CAROTID SYSTEM: Visualized left CCA patent without stenosis. No
significant atheromatous irregularity or narrowing about the left
carotid bulb. Visualized left ICA patent distally without stenosis
or dissection.

VERTEBRAL ARTERIES: Visualized vertebral arteries widely patent with
antegrade flow. No evidence for dissection or stenosis.
IMPRESSION: MRI HEAD:

1. Unchanged 9 mm acute intraparenchymal hemorrhage centered at the
right thalamus. Minimal surrounding vasogenic edema without
significant regional mass effect. This likely reflects a hemorrhagic
stroke, suspected to be hypertensive in nature.
2. Few scattered chronic micro hemorrhages about the cerebellum and
thalami, likely related to chronic poorly controlled hypertension.
3. Underlying mild chronic microvascular ischemic disease with
multiple additional remote lacunar infarcts as above.

MRA HEAD:

1. Negative intracranial MRA for large vessel occlusion.
2. Severe atheromatous disease involving the right greater than left
A2/A3 segments with associated moderate to severe multifocal
stenoses.
3. Otherwise wide patency of the major intracranial arterial
vasculature. No other proximal high-grade or correctable stenosis.

MRA NECK:

Negative MRA of the neck with wide patency of both carotid artery
systems and vertebral arteries. No flow-limiting stenosis or other
acute vascular abnormality.

## 2021-04-12 IMAGING — MR MR MRA NECK W/O CM
2 series · 38 of 48 positions shown · non-contrast
Comparison: Prior CT from [DATE].

CLINICAL DATA: Follow-up examination for acute stroke.



[Series 6: vessel_scout_neck · sagittal · 5.0mm · 1.37mm/px · 8 of 26 slices shown]
[im 1/26]
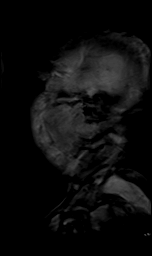
[im 4/26]
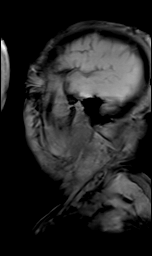
[im 8/26]
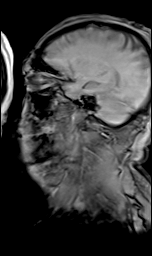
[im 11/26]
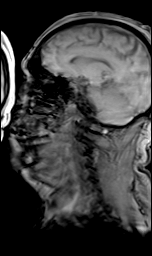
[im 15/26]
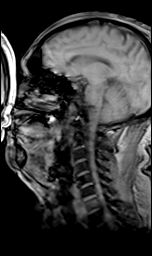
[im 18/26]
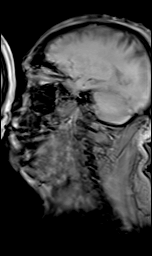
[im 22/26]
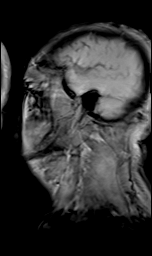
[im 26/26]
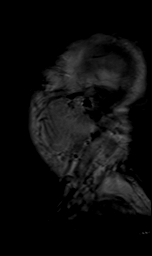

[Series 10: TOF · axial · 0.6mm · 0.52mm/px · z∈[-142,-67]mm · 30 of 133 slices shown]
[im 1/133]
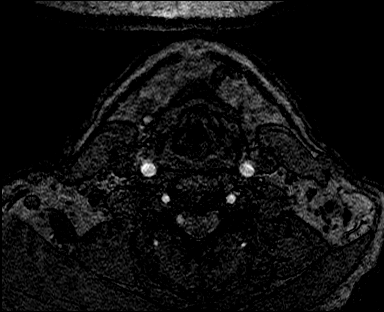
[im 4/133]
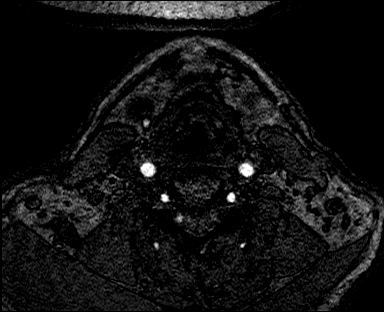
[im 7/133]
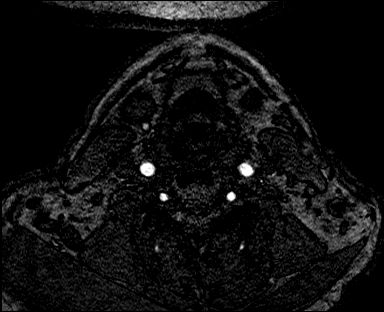
[im 11/133]
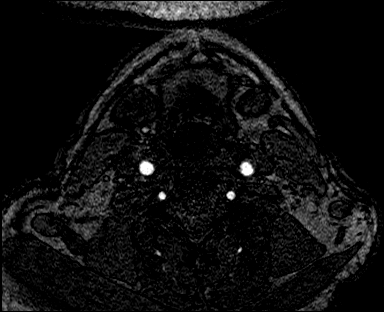
[im 14/133]
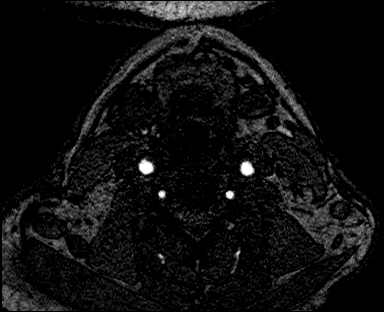
[im 17/133]
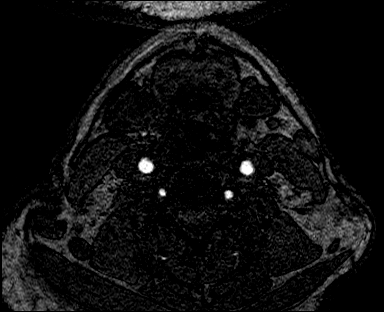
[im 21/133]
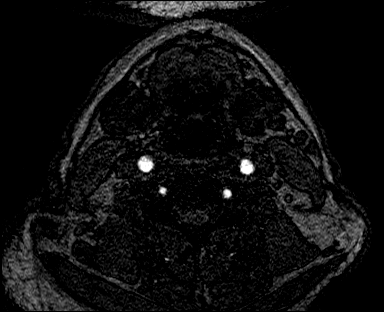
[im 24/133]
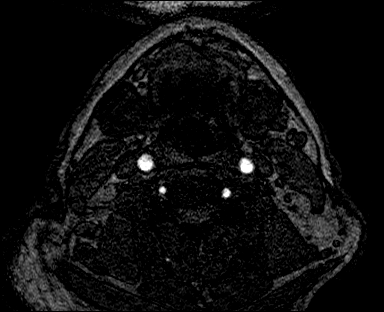
[im 28/133]
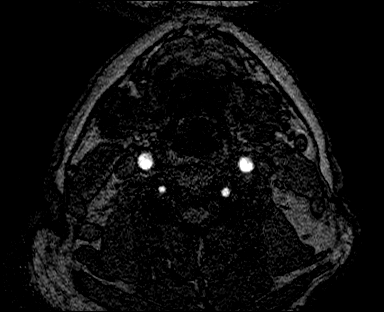
[im 31/133]
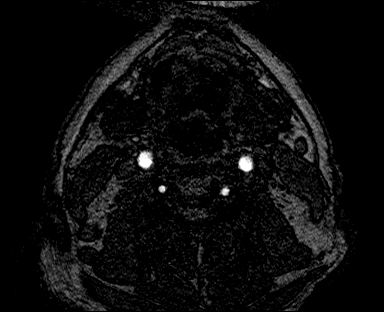
[im 34/133]
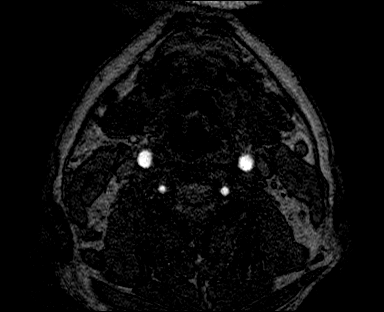
[im 38/133]
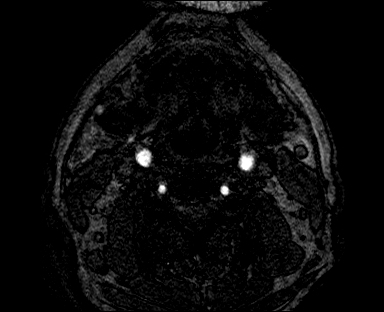
[im 41/133]
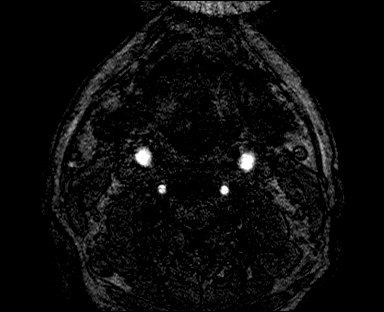
[im 45/133]
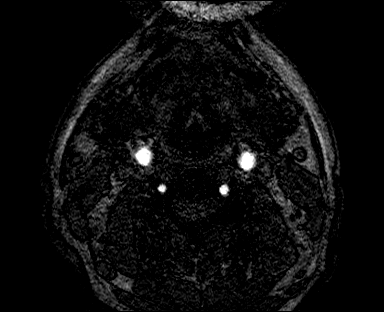
[im 48/133]
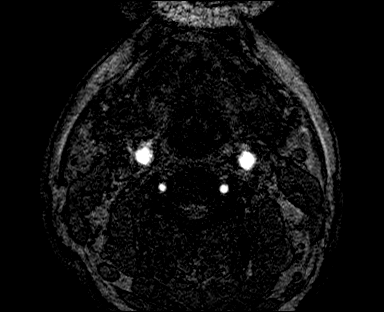
[im 51/133]
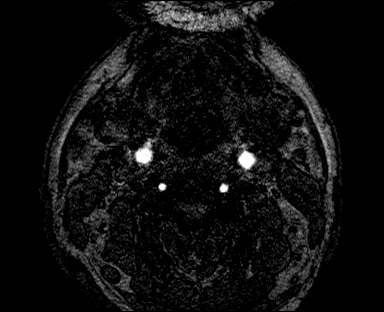
[im 55/133]
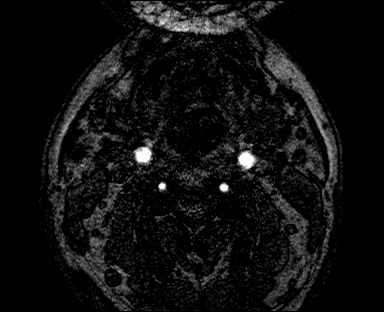
[im 58/133]
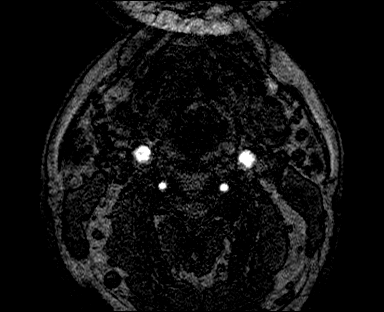
[im 61/133]
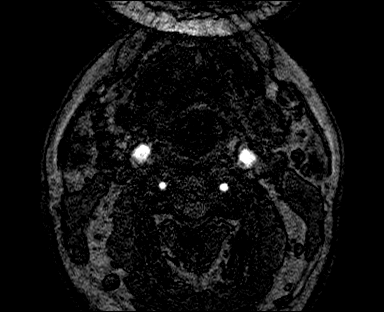
[im 65/133]
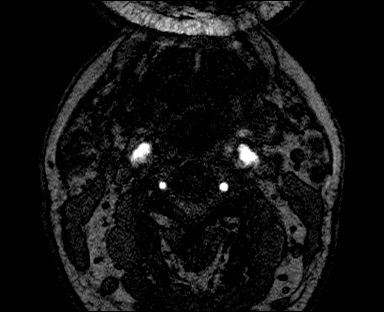
[im 68/133]
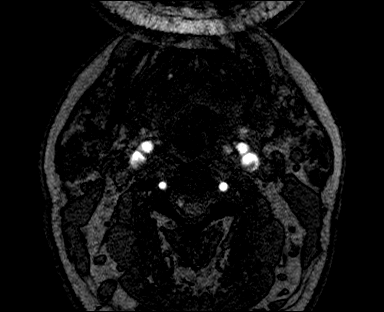
[im 72/133]
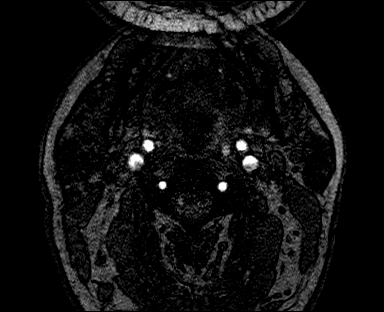
[im 75/133]
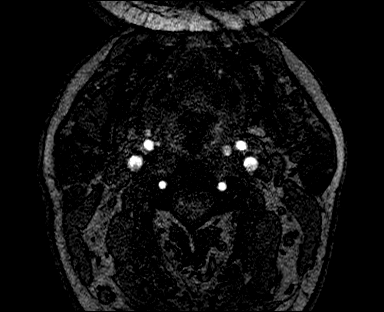
[im 78/133]
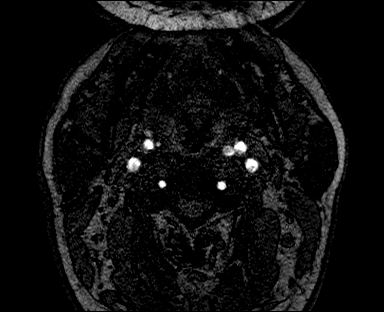
[im 82/133]
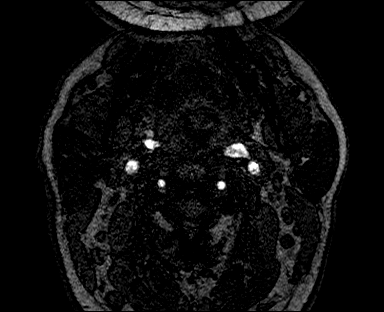
[im 85/133]
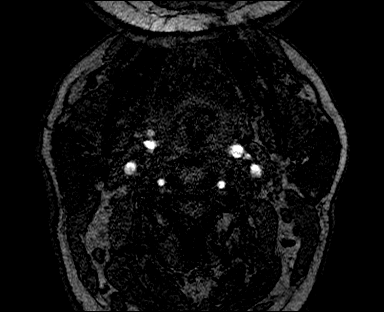
[im 92/133]
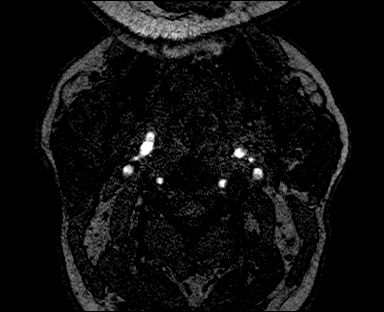
[im 109/133]
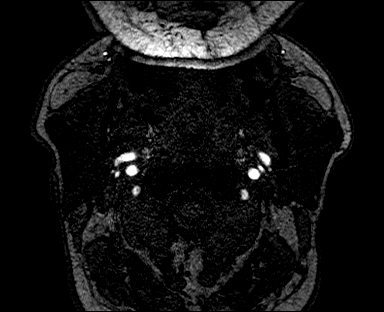
[im 112/133]
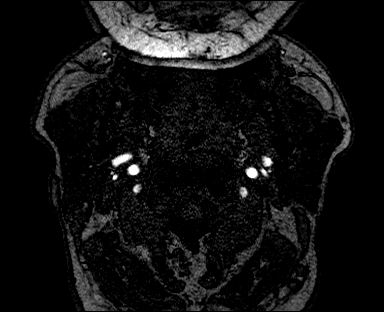
[im 126/133]
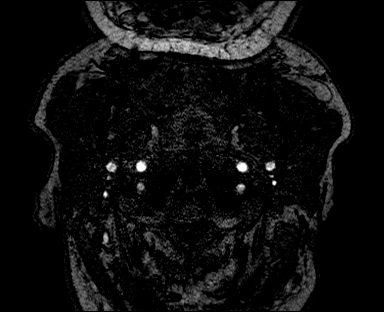

[38 of 48 positions shown; findings below may reference images not displayed]

FINDINGS: MRI HEAD FINDINGS

Brain: Cerebral volume within normal limits. Patchy T2/FLAIR
hyperintensity involving the periventricular deep white matter both
cerebral hemispheres most consistent with chronic small vessel
ischemic disease, mild in nature. Few small remote lacunar infarcts
present at the left anterior corpus callosum, bilateral basal
ganglia, left thalamus, and pons.

Previously identified acute intraparenchymal hemorrhage centered at
the right thalamus again seen, not significantly changed in size
measuring up to 9 mm. Minimal surrounding vasogenic edema without
significant regional mass effect. No intraventricular extension.
This likely reflects a hemorrhagic stroke, suspected to be
hypertensive in nature. No other acute intracranial hemorrhage
elsewhere within the brain. Few additional chronic micro hemorrhages
noted about the cerebellum and thalami, most like related to chronic
poorly controlled hypertension.

No mass lesion, midline shift or mass effect. No hydrocephalus or
extra-axial fluid collection. Pituitary gland suprasellar region
normal.

Vascular: Major intracranial vascular flow voids are maintained.

Skull and upper cervical spine: Craniocervical junction within
normal limits. Bone marrow signal intensity normal. No scalp soft
tissue abnormality.

Sinuses/Orbits: Globes orbital soft tissues demonstrate no acute
finding. Chronic posttraumatic defect noted at the right lamina
papyracea. Mild scattered mucosal thickening noted within the
ethmoidal air cells and maxillary sinuses. No mastoid effusion.
Inner ear structures grossly normal.

Other: None.

MRA HEAD FINDINGS

ANTERIOR CIRCULATION:

Visualized distal cervical segments of the internal carotid arteries
are patent with antegrade flow. Petrous, cavernous, and supraclinoid
segments patent without stenosis or other abnormality. A1 segments
patent bilaterally. Normal anterior communicating artery complex.
Severe atheromatous disease seen involving the right greater than
left A2/A3 segments with associated moderate to severe multifocal
stenoses (series [1J], image 12). ACAs remain patent to their distal
aspects. No M1 stenosis or occlusion. Normal MCA bifurcations.
Distal MCA branches perfused and symmetric.

POSTERIOR CIRCULATION:

Vertebral arteries are largely codominant widely patent to the
vertebrobasilar junction. Left PICA patent. Right PICA not seen.
Dominant right AICA. Basilar patent to its distal aspect without
stenosis. Superior cerebral arteries patent bilaterally. Right PCA
supplied via the basilar. Left PCA supplied via the basilar as well
as a robust left posterior communicating artery. Both PCAs widely
patent and well perfused to their distal aspects.

No intracranial aneurysm.

MRA NECK FINDINGS

AORTIC ARCH: Aortic arch and origin of the great vessels not
visualized on this exam.

RIGHT CAROTID SYSTEM: Visualized right CCA widely patent to the
bifurcation without stenosis. No significant atheromatous narrowing
about the right carotid bulb. Right ICA patent distally without
stenosis or dissection.

LEFT CAROTID SYSTEM: Visualized left CCA patent without stenosis. No
significant atheromatous irregularity or narrowing about the left
carotid bulb. Visualized left ICA patent distally without stenosis
or dissection.

VERTEBRAL ARTERIES: Visualized vertebral arteries widely patent with
antegrade flow. No evidence for dissection or stenosis.
IMPRESSION: MRI HEAD:

1. Unchanged 9 mm acute intraparenchymal hemorrhage centered at the
right thalamus. Minimal surrounding vasogenic edema without
significant regional mass effect. This likely reflects a hemorrhagic
stroke, suspected to be hypertensive in nature.
2. Few scattered chronic micro hemorrhages about the cerebellum and
thalami, likely related to chronic poorly controlled hypertension.
3. Underlying mild chronic microvascular ischemic disease with
multiple additional remote lacunar infarcts as above.

MRA HEAD:

1. Negative intracranial MRA for large vessel occlusion.
2. Severe atheromatous disease involving the right greater than left
A2/A3 segments with associated moderate to severe multifocal
stenoses.
3. Otherwise wide patency of the major intracranial arterial
vasculature. No other proximal high-grade or correctable stenosis.

MRA NECK:

Negative MRA of the neck with wide patency of both carotid artery
systems and vertebral arteries. No flow-limiting stenosis or other
acute vascular abnormality.

## 2021-04-12 MED ORDER — LABETALOL HCL 5 MG/ML IV SOLN
10.0000 mg | INTRAVENOUS | Status: DC | PRN
  Administered 2021-04-12 (×2): 10 mg via INTRAVENOUS
  Filled 2021-04-12 (×2): qty 4

## 2021-04-12 MED ORDER — POTASSIUM & SODIUM PHOSPHATES 280-160-250 MG PO PACK
1.0000 | PACK | Freq: Three times a day (TID) | ORAL | Status: AC
  Administered 2021-04-12 (×3): 1 via ORAL
  Filled 2021-04-12 (×4): qty 1

## 2021-04-12 MED ORDER — PERFLUTREN LIPID MICROSPHERE
1.0000 mL | INTRAVENOUS | Status: AC | PRN
  Administered 2021-04-12: 2 mL via INTRAVENOUS
  Filled 2021-04-12: qty 10

## 2021-04-12 MED ORDER — HYDRALAZINE HCL 20 MG/ML IJ SOLN
10.0000 mg | INTRAMUSCULAR | Status: DC | PRN
  Administered 2021-04-12: 10 mg via INTRAVENOUS
  Administered 2021-04-13 (×4): 20 mg via INTRAVENOUS
  Filled 2021-04-12 (×5): qty 1

## 2021-04-12 MED ORDER — CHLORHEXIDINE GLUCONATE CLOTH 2 % EX PADS
6.0000 | MEDICATED_PAD | Freq: Every day | CUTANEOUS | Status: DC
  Administered 2021-04-13 – 2021-04-14 (×2): 6 via TOPICAL

## 2021-04-12 MED ORDER — ATORVASTATIN CALCIUM 20 MG PO TABS
80.0000 mg | ORAL_TABLET | Freq: Every day | ORAL | Status: DC
  Administered 2021-04-12 – 2021-04-14 (×3): 80 mg via ORAL
  Filled 2021-04-12 (×3): qty 4

## 2021-04-12 MED ORDER — AMLODIPINE BESYLATE 10 MG PO TABS
10.0000 mg | ORAL_TABLET | Freq: Every day | ORAL | Status: DC
  Administered 2021-04-12 – 2021-04-14 (×3): 10 mg via ORAL
  Filled 2021-04-12 (×3): qty 1
  Filled 2021-04-12: qty 2

## 2021-04-12 MED ORDER — HYDROCHLOROTHIAZIDE 12.5 MG PO TABS
12.5000 mg | ORAL_TABLET | Freq: Two times a day (BID) | ORAL | Status: DC
  Administered 2021-04-12 – 2021-04-14 (×4): 12.5 mg via ORAL
  Filled 2021-04-12 (×4): qty 1

## 2021-04-12 MED ORDER — PANTOPRAZOLE SODIUM 40 MG PO TBEC
40.0000 mg | DELAYED_RELEASE_TABLET | Freq: Every day | ORAL | Status: DC
  Administered 2021-04-12 – 2021-04-13 (×2): 40 mg via ORAL
  Filled 2021-04-12 (×2): qty 1

## 2021-04-12 MED ORDER — HYDROCHLOROTHIAZIDE 25 MG PO TABS
12.5000 mg | ORAL_TABLET | Freq: Every day | ORAL | Status: DC
  Administered 2021-04-12: 12.5 mg via ORAL
  Filled 2021-04-12: qty 1

## 2021-04-12 MED ORDER — LABETALOL HCL 5 MG/ML IV SOLN
10.0000 mg | INTRAVENOUS | Status: DC | PRN
  Administered 2021-04-12: 10 mg via INTRAVENOUS
  Administered 2021-04-13 (×3): 20 mg via INTRAVENOUS
  Filled 2021-04-12 (×4): qty 4

## 2021-04-12 MED ORDER — POTASSIUM CHLORIDE CRYS ER 20 MEQ PO TBCR
40.0000 meq | EXTENDED_RELEASE_TABLET | Freq: Once | ORAL | Status: AC
  Administered 2021-04-12: 40 meq via ORAL
  Filled 2021-04-12: qty 2

## 2021-04-12 MED ORDER — HYDRALAZINE HCL 20 MG/ML IJ SOLN
10.0000 mg | Freq: Four times a day (QID) | INTRAMUSCULAR | Status: DC | PRN
  Administered 2021-04-12: 10 mg via INTRAVENOUS
  Filled 2021-04-12: qty 1

## 2021-04-12 NOTE — ED Notes (Addendum)
Attempted to call report to stepdown with the number provided on tracker. No answer after multiple rings. Will continue to try to call back and give report to receiving RN.

## 2021-04-12 NOTE — ED Notes (Signed)
Tried to call a second time to give report on patient. No answer after multiple rings. Will call unit #

## 2021-04-12 NOTE — Progress Notes (Signed)
PHARMACIST - PHYSICIAN COMMUNICATION  CONCERNING: IV to Oral Route Change Policy  RECOMMENDATION: This patient is receiving pantoprazole by the intravenous route.  Based on criteria approved by the Pharmacy and Therapeutics Committee, the intravenous medication(s) is/are being converted to the equivalent oral dose form(s).   DESCRIPTION: These criteria include: The patient is eating (either orally or via tube) and/or has been taking other orally administered medications for a least 24 hours The patient has no evidence of active gastrointestinal bleeding or impaired GI absorption (gastrectomy, short bowel, patient on TNA or NPO).  If you have questions about this conversion, please contact the [place], [facility] 04/12/2021 10:42 AM

## 2021-04-12 NOTE — ED Notes (Signed)
MD at the bedside  

## 2021-04-12 NOTE — Progress Notes (Signed)
*  PRELIMINARY RESULTS* Echocardiogram 2D Echocardiogram has been performed.  Wallie Char Sadi Arave 04/12/2021, 11:19 AM

## 2021-04-12 NOTE — ED Notes (Signed)
Informed RN bed assigned 

## 2021-04-12 NOTE — ED Notes (Signed)
Report received from Lindsay, RN

## 2021-04-12 NOTE — Progress Notes (Signed)
Chart reviewed. No apparent speech language or swallowing deficits identified. Eating 100 percent of regular consistency. Please notify ST or reconsult if speech or swallowing concerns arise.

## 2021-04-12 NOTE — ED Notes (Addendum)
Patient up in recliner with family at bedside. Patient ambulatory without assistance. SCD's not appropriate at this time.

## 2021-04-12 NOTE — ED Notes (Signed)
Changed patient's linens, pillow cases, and gown.

## 2021-04-12 NOTE — Consult Note (Signed)
[facility] for Electrolyte Monitoring and Replacement   Recent Labs: Potassium (mmol/L)  Date Value  04/12/2021 3.4 (L)   Magnesium (mg/dL)  Date Value  04/12/2021 2.2   Calcium (mg/dL)  Date Value  04/12/2021 8.5 (L)   Albumin (g/dL)  Date Value  04/12/2021 3.9  01/12/2017 4.6   Phosphorus (mg/dL)  Date Value  04/12/2021 2.1 (L)   Sodium (mmol/L)  Date Value  04/12/2021 135  01/12/2017 143   Assessment: Patient is a 56 y/o M with medical history including hypertension, medication non-compliance, OSA who is admitted with hypertensive emergency complicated by intraparenchymal hemorrhage. Pharmacy consulted to assist with electrolyte monitoring and replacement as indicated.  Goal of Therapy:  Electrolytes within normal limits  Plan:  --K 3.4, will give Kcl 40 mEq PO x 1 --Phos 2.1, will give Phos-Nak 1 packet PO TIDHS x 3 doses --Follow-up electrolytes with AM labs tomorrow  Benita Gutter 04/12/2021 10:37 AM

## 2021-04-12 NOTE — Progress Notes (Addendum)
Neurology Progress Note [name] MR# [id number] 04/12/2021  S: no overnight events; no new complaints.  O: Current vital signs: BP 140/85    Pulse 62    Temp 98.1 F (36.7 C)    Resp 20    Ht 5\' 5"  (1.651 m)    Wt 88.5 kg    SpO2 95%    BMI 32.45 kg/m  Vital signs in last 24 hours: Temp:  [98.1 F (36.7 C)] 98.1 F (36.7 C) (01/12 1039) Pulse Rate:  [54-79] 62 (01/13 0930) Resp:  [11-32] 20 (01/13 0930) BP: (110-215)/(71-115) 140/85 (01/13 0930) SpO2:  [78 %-99 %] 95 % (01/13 0930) Weight:  [88.5 kg] 88.5 kg (01/12 1039) GENERAL: Awake, alert in NAD HEENT: Normocephalic and atraumatic, moist mm, no LN++, no thyromegaly LUNGS: symmetric excursions bilaterally with no audible wheezes. CV: RR, equal pulses bilaterally. ABDOMEN: Soft, nontender, nondistended with normoactive BS Ext: warm, well perfused, intact peripheral pulses  NEURO:  Mental Status: AA&Ox3  Language: speech is fluent.  Intact naming, repetition, and comprehension. PERR. EOMI, visual fields full, no facial asymmetry, intact facial sensation intact, hearing intact. No evidence of tongue atrophy or fibrillations, tongue/uvula/soft palate midline elevates symmetrically  Normal sternocleidomastoid and trapezius muscle strength. Motor: 5/5 strength in all extremities. Tone: Tone and bulk is normal Sensation: Intact to light touch bilaterally Coordination: FTN intact bilaterally, no ataxia in BLE. Gait - Deferred  NIHSS 0  Labs A1c 5.9 LDL 105  Imaging I have reviewed images in epic and the results pertinent to this consultation are:  MRI Brain showed unchanged 9 mm acute intraparenchymal hemorrhage centered at the right thalamus. Minimal surrounding vasogenic edema without significant regional mass effect. 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 Few scattered chronic micro hemorrhages about the cerebellum and thalami, likely related to chronic poorly controlled  hypertension.  Underlying mild chronic microvascular ischemic disease with multiple additional remote lacunar infarcts as above.  MRA HEAD: Negative intracranial MRA for large vessel occlusion. severe atheromatous disease involving the right greater than left A2/A3 segments with associated moderate to severe multifocal stenoses. Otherwise wide patency of the major intracranial arterial vasculature. No other proximal high-grade or correctable stenosis.   MRA NECK: Negative MRA of the neck with wide patency of both carotid artery systems and vertebral arteries. No flow-limiting stenosis or other acute vascular abnormality.    Assessment: Ian Moore is a 56 y.o. male PMHx noted above with hypertensive hemorrhage in ventrobasal complex of right thalamus and residual interruption of sensory afferent of LUE>>>LLE now resolved. 6 hour follow up NCT showed stable hemorrhage. MRI confirmed bleed is stable, however he has scattered areas of mixed microbleeds and remote lacunar strokes and angiography showed severe left A2/A3 stenosis.   Recommendations: - Recommend statin for goal LDL <70 - A1c 5.9 is at goal. - BP goal <140/90. - Telemetry monitoring for arrhythmia. - He will need to start aspirin 81mg  after repeat CT.  - His wife will need a work excuse for 04/11/2021 to 04/12/2021. - He will also need a work excuse on discharge.  Electronically signed by:  Lynnae Sandhoff, MD Page: [phone] 04/12/2021, 9:52 AM  If 7pm- 7am, please page neurology on call as listed in [place].

## 2021-04-12 NOTE — Progress Notes (Signed)
NAME:  Ian Moore, MRN:  [id number], DOB:  07-05-1965, LOS: 1 ADMISSION DATE:  04/11/2021, CONSULTATION DATE:  04/11/21 REFERRING MD:  Dr. Kerman Passey, CHIEF COMPLAINT:  Left sided numbness   Brief Pt Description / Synopsis:  56 y.o. Male admitted with Right hemorrhagic Thalamic Stroke in the setting of Hypertensive Emergency/uncontrolled Hypertension, requiring Nicardipine infusion.  Neurology following.  History of Present Illness:  56 y.o with significant PMH of uncontrolled hypertension (previously treated but was recently taken off medications  x 3 months for unclear reasons), and former smoker who presented to the ED with chief complaints of left-sided weakness and numbness.   Patient report that he went to bed around 2000 last night and woke up with left sided numbness.  Denies associated altered sensorium, speech abnormality, cranial nerve deficit, seizures, focal motor deficits, diplopia, nausea or vomiting, syncope or LOC. Patient state he went to work but symptoms persistent so he called his daughter who advised him to come to the ED for further evaluation.   ED Course: On arrival to the ED, he was afebrile with blood pressure 224/114 mm Hg and pulse rate 81 beats/min. There were no focal neurological deficits; he was alert and oriented x4, and he did not demonstrate any memory deficits. Complete blood count (CBC), thyroid-stimulating hormone (TSH) -reflex, urinalysis, drug of abuse screen were unremarkable other than comprehensive metabolic panel (CMP) which showed hypokalemia and elevated blood glucose levels of 155; an insidious infectious workup was initiated, including  SARS-CoV-2 PCR and Influenza PCR that were ultimately negative. ECG showed sinus rhythm of 51 beats per minute and the chest X-ray was normal. A non-contrast head CT showed an acute right thalamic intraparenchymal hemorrhage with mild surrounding edema. Patient was evaluated by in house Neurologist at the bedside.  Initial NIHSS: 1, ICH score: 0. PCCM consulted for admission to ICU for close monitoring.  Pertinent  Medical History  Hypertension Former smoker  Psychologist, forensic Data:  04/11/21: SARS-CoV-2 PCR> negative 04/11/21: Influenza PCR> negative  Antimicrobials:  N/A  Significant Hospital Events: Including procedures, antibiotic start and stop dates in addition to other pertinent events   1/12: Admitted to the ICU with acute right thalamic hemorrhagic stroke 1/13: Follow up imaging shows stable right thalamic stroke.  Will resume home Norvasc and HCTZ in order to wean Nicardipine infusion.  Interim History / Subjective:  -No significant events overnight, no new complaints -Follow up imaging shows stable right thalamic stroke -Neuro exam without focal deficits -Pt reports left sided weakness/numbness has resolved with the exception of slight numbness of left upper leg -MRA Head/Neck with no large vessel occlusion, wide patency of carotid arteries -Currently remains on Nicardipine gtt to maintain SBP <140 -Will resume home Norvasc and HCTZ in hopes to begin weaning Nicardipine -Passed bedside swallow, can begin diet -Family updated at bedside  Objective   Blood pressure 140/85, pulse 62, temperature 98.1 F (36.7 C), resp. rate 20, height 5\' 5"  (1.651 m), weight 88.5 kg, SpO2 95 %.       No intake or output data in the 24 hours ending 04/12/21 0953 Filed Weights   04/11/21 1039  Weight: 88.5 kg    Examination: General: Acutely ill-appearing male, sitting in bed, on room air, no acute distress HENT: Atraumatic, normocephalic, neck supple, no JVD Lungs: Clear breath sounds bilaterally, no wheezing or rales noted, even, nonlabored, no accessory muscle use Cardiovascular: Regular rate and rhythm, S1-S2, no murmurs, rubs, gallop Abdomen: Soft, nontender, nondistended, no guarding rebound tenderness, bowel sounds positive  x4 Extremities: Normal bulk and tone, no deformities, no edema Neuro:  Awake, alert and oriented x4, follows commands with no focal deficits, speech clear, pupils PERRLA 3 mm bilaterally GU: Deferred  Resolved Hospital Problem list     Assessment & Plan:   Right Hemorrhagic Thalamic Stroke in the setting of Hypertensive Emergency/uncontrolled Hypertension PMHx: Hypertension -Continuous cardiac monitoring -Continue Nicardipine gtt for goal BP<140/90, PRN Labetalol -Will resume home Norvasc and HCTZ -MRI brain shows stable bleed, does scattered areas of mixed microbleeds and remote lacunar strokes.  -MRA Head/Neck shows no large vessel occlusion and widely patent carotid arteries -Echocardiogram pending to r/o cardioembolic source, assess EF  -Fasting Lipid Panel with Direct LDL 105  -Will start Statin for goal LDH <70 (LFT's normal on admission) -TSH normal 1.6 -HgA1c 5.9 consistent with Prediabetes -HOB >= 30 degrees with aspiration precautions -INR goal <1.4, daily coags.  -No HepSQ, antiplatelet or anticoagulant agents at this time -Place SCDs for DVT prevention. -Passed bedside swallow evaluation, advance diet at tolerated -PT consult, OT consult, Speech consult -Aspiration precautions, Bleeding precautions -Obtain STAT head CT for any new acute headache or new neurological deficits -Neurology following, appreciate input.    Hypokalemia -Monitor I&O's / urinary output -Follow BMP -Replace electrolytes as indicated   Hyperglycemia with Prediabetes -Check HgA1c ~ 5.9 -CBGs -Sliding scale insulin -Follow ICU hyper/hypoglycemia protocol  Best Practice (right click and "Reselect all SmartList Selections" daily)   Diet/type: Regular consistency (see orders) DVT prophylaxis: SCD GI prophylaxis: PPI Lines: N/A Foley:  N/A Code Status:  full code Last date of multidisciplinary goals of care discussion [04/12/2021]  Updated pt's daughter and son at bedside.  Labs   CBC: Recent Labs  Lab 04/11/21 1041  WBC 8.0  NEUTROABS 5.5  HGB 14.7   HCT 42.1  MCV 83.5  PLT 725    Basic Metabolic Panel: Recent Labs  Lab 04/11/21 1041 04/12/21 0620  NA 136 137  K 2.7* 3.0*  CL 102 104  CO2 23 25  GLUCOSE 155* 120*  BUN 17 11  CREATININE 0.95 0.66  CALCIUM 8.9 8.3*  MG  --  2.2  PHOS  --  2.1*   GFR: Estimated Creatinine Clearance: 105.4 mL/min (by C-G formula based on SCr of 0.66 mg/dL). Recent Labs  Lab 04/11/21 1041  WBC 8.0    Liver Function Tests: Recent Labs  Lab 04/11/21 1041 04/12/21 0620  AST 33  --   ALT 37  --   ALKPHOS 49  --   BILITOT 0.9  --   PROT 8.0  --   ALBUMIN 3.9 3.9   No results for input(s): LIPASE, AMYLASE in the last 168 hours. No results for input(s): AMMONIA in the last 168 hours.  ABG No results found for: PHART, PCO2ART, PO2ART, HCO3, TCO2, ACIDBASEDEF, O2SAT   Coagulation Profile: Recent Labs  Lab 04/11/21 1041  INR 1.1    Cardiac Enzymes: No results for input(s): CKTOTAL, CKMB, CKMBINDEX, TROPONINI in the last 168 hours.  HbA1C: Hgb A1c MFr Bld  Date/Time Value Ref Range Status  04/11/2021 10:41 AM 5.9 (H) 4.8 - 5.6 % Final    Comment:    (NOTE) Pre diabetes:          5.7%-6.4%  Diabetes:              >6.4%  Glycemic control for   <7.0% adults with diabetes     CBG: No results for input(s): GLUCAP in the last 168 hours.  Review  of Systems:   Positives in BOLD: Pt denies all complaints Gen: Denies fever, chills, weight change, fatigue, night sweats HEENT: Denies blurred vision, double vision, hearing loss, tinnitus, sinus congestion, rhinorrhea, sore throat, neck stiffness, dysphagia PULM: Denies shortness of breath, cough, sputum production, hemoptysis, wheezing CV: Denies chest pain, edema, orthopnea, paroxysmal nocturnal dyspnea, palpitations GI: Denies abdominal pain, nausea, vomiting, diarrhea, hematochezia, melena, constipation, change in bowel habits GU: Denies dysuria, hematuria, polyuria, oliguria, urethral discharge Endocrine: Denies hot or  cold intolerance, polyuria, polyphagia or appetite change Derm: Denies rash, dry skin, scaling or peeling skin change Heme: Denies easy bruising, bleeding, bleeding gums Neuro: Denies headache, numbness, weakness, slurred speech, loss of memory or consciousness   Past Medical History:  He,  has a past medical history of Hypertension.   Surgical History:  History reviewed. No pertinent surgical history.   Social History:   reports that he has quit smoking. He has never used smokeless tobacco. He reports that he does not drink alcohol and does not use drugs.   Family History:  His family history is not on file.   Allergies Allergies  Allergen Reactions   Influenza A (H1n1) Monoval Vac Rash   Penicillins Rash    Has patient had a PCN reaction causing immediate rash, facial/tongue/throat swelling, SOB or lightheadedness with hypotension: No Has patient had a PCN reaction causing severe rash involving mucus membranes or skin necrosis: No Has patient had a PCN reaction that required hospitalization: No Has patient had a PCN reaction occurring within the last 10 years: No If all of the above answers are "NO", then may proceed with Cephalosporin use.      Home Medications  Prior to Admission medications   Medication Sig Start Date End Date Taking? Authorizing Provider  amLODipine (NORVASC) 10 MG tablet Take 1 tablet (10 mg total) by mouth daily. Patient not taking: Reported on 04/11/2021 03/22/20   Verl Bangs, FNP  hydrochlorothiazide (HYDRODIURIL) 12.5 MG tablet Take 1 tablet (12.5 mg total) by mouth daily. Patient not taking: Reported on 04/11/2021 04/12/20   Verl Bangs, FNP     Critical care time: 40 minutes     Darel Hong, AGACNP-BC [facility] Prefer epic messenger for cross cover needs If after hours, please call E-link

## 2021-04-12 NOTE — ED Notes (Signed)
Patient ate 100% of lunch tray. 

## 2021-04-13 ENCOUNTER — Encounter: Payer: Self-pay | Admitting: Family Medicine

## 2021-04-13 DIAGNOSIS — E876 Hypokalemia: Secondary | ICD-10-CM

## 2021-04-13 LAB — BASIC METABOLIC PANEL
Anion gap: 8 (ref 5–15)
BUN: 16 mg/dL (ref 6–20)
CO2: 25 mmol/L (ref 22–32)
Calcium: 8.9 mg/dL (ref 8.9–10.3)
Chloride: 101 mmol/L (ref 98–111)
Creatinine, Ser: 0.82 mg/dL (ref 0.61–1.24)
GFR, Estimated: >60 mL/min (ref >60)
Glucose, Bld: 139 mg/dL — ABNORMAL HIGH (ref 70–99)
Potassium: 2.9 mmol/L — ABNORMAL LOW (ref 3.5–5.1)
Sodium: 134 mmol/L — ABNORMAL LOW (ref 135–145)

## 2021-04-13 LAB — CBC
HCT: 42.4 % (ref 39.0–52.0)
Hemoglobin: 14.9 g/dL (ref 13.0–17.0)
MCH: 29.2 pg (ref 26.0–34.0)
MCHC: 35.1 g/dL (ref 30.0–36.0)
MCV: 83 fL (ref 80.0–100.0)
Platelets: 214 10*3/uL (ref 150–400)
RBC: 5.11 MIL/uL (ref 4.22–5.81)
RDW: 12.5 % (ref 11.5–15.5)
WBC: 8.5 10*3/uL (ref 4.0–10.5)
nRBC: 0 % (ref 0.0–0.2)

## 2021-04-13 LAB — POTASSIUM: Potassium: 3.6 mmol/L (ref 3.5–5.1)

## 2021-04-13 LAB — MAGNESIUM: Magnesium: 2.3 mg/dL (ref 1.7–2.4)

## 2021-04-13 LAB — PHOSPHORUS: Phosphorus: 4.1 mg/dL (ref 2.5–4.6)

## 2021-04-13 MED ORDER — POTASSIUM CHLORIDE 10 MEQ/100ML IV SOLN
10.0000 meq | INTRAVENOUS | Status: AC
  Administered 2021-04-13 (×4): 10 meq via INTRAVENOUS
  Filled 2021-04-13 (×4): qty 100

## 2021-04-13 MED ORDER — ONDANSETRON HCL 4 MG/2ML IJ SOLN
4.0000 mg | Freq: Four times a day (QID) | INTRAMUSCULAR | Status: DC | PRN
  Administered 2021-04-13: 4 mg via INTRAVENOUS
  Filled 2021-04-13: qty 2

## 2021-04-13 MED ORDER — POTASSIUM CHLORIDE CRYS ER 20 MEQ PO TBCR
40.0000 meq | EXTENDED_RELEASE_TABLET | Freq: Once | ORAL | Status: AC
  Administered 2021-04-13: 40 meq via ORAL
  Filled 2021-04-13: qty 2

## 2021-04-13 NOTE — Evaluation (Signed)
Physical Therapy Evaluation Patient Details Name: Ian Moore MRN: [id number] DOB: 05-Aug-1965 Today's Date: 04/13/2021  History of Present Illness  Pt is a 56 y/o M admitted on 04/11/21 with R hemorrhagic thalamic stroke in the setting of hypertensive emergency/uncontrolled HTN, requiring nicardipine infusion, after pt experienced L sided weakness/numbness. PMH: uncontrolled HTN (previously treated but recently taken off meds 3 months ago for unclear reasons), former smoker  Clinical Impression  Pt seen for PT evaluation with in-person interpreter Ian Moore) utilized. Pt reports independence & working prior to admission. On this date, pt is able to complete all aspects of mobility (bed mobility, sit<>stand transfers & ambulate 2 laps around ICU) with independence without concern re: LOB. Encouraged pt to ambulate with family assistance & get MD clearance for return to work. Discussed importance of taking medications as prescribed by MD & risks of another stroke. Pt did require IV BP medication to lower BP for safe participation during session. At this time pt does not demonstrate any acute PT needs & PT to sign off at this time.  BP checked in RUE at beginning of session: 175/104 mmHg MAP 123 BP checked in LUE with larger, more appropriate fitting cuff, supine: 178/97 mmHg MAP 119 Nurse administered IV BP medication & ~1 minute later BP in RUE: 114/76 mmHg MAP 89 BP at end of session, sitting EOB: 134/72 mmHg MAP 91     Recommendations for follow up therapy are one component of a multi-disciplinary discharge planning process, led by the attending physician.  Recommendations may be updated based on patient status, additional functional criteria and insurance authorization.  Follow Up Recommendations No PT follow up    Assistance Recommended at Discharge PRN  Patient can return home with the following       Equipment Recommendations None recommended by PT  Recommendations for Other  Services       Functional Status Assessment Patient has not had a recent decline in their functional status     Precautions / Restrictions Precautions Precautions: None Restrictions Weight Bearing Restrictions: No      Mobility  Bed Mobility Overal bed mobility: Modified Independent (supine>sit with HOB slightly elevated)                  Transfers Overall transfer level: Independent Equipment used: None               General transfer comment: sit>stand without AD    Ambulation/Gait Ambulation/Gait assistance: Independent Gait Distance (Feet): 200 Feet Assistive device: None Gait Pattern/deviations: WFL(Within Functional Limits) Gait velocity: WNL        Stairs            Wheelchair Mobility    Modified Rankin (Stroke Patients Only)       Balance Overall balance assessment: No apparent balance deficits (not formally assessed)                                           Pertinent Vitals/Pain Pain Assessment: No/denies pain    Home Living Family/patient expects to be discharged to:: Private residence Living Arrangements: Spouse/significant other;Children Available Help at Discharge: Family;Available 24 hours/day Type of Home: House Home Access: Level entry       Home Layout: One level        Prior Function Prior Level of Function : Independent/Modified Independent  Mobility Comments: Driving, working in active occupation (making mattresses)       Hand Dominance   Dominant Hand: Right    Extremity/Trunk Assessment   Upper Extremity Assessment Upper Extremity Assessment: Overall WFL for tasks assessed    Lower Extremity Assessment Lower Extremity Assessment: Overall WFL for tasks assessed (BLE proprioception & sensation (to light touch) intact)       Communication   Communication: Interpreter utilized (Pt is Spanish speaking. Utilized in-person interpreter, Ian Moore, during  session.)  Cognition Arousal/Alertness: Awake/alert Behavior During Therapy: WFL for tasks assessed/performed Overall Cognitive Status: Within Functional Limits for tasks assessed                                          General Comments General comments (skin integrity, edema, etc.): Educated pt & family on importance of taking medications as perscribed by MD and risks of another stroke.    Exercises     Assessment/Plan    PT Assessment Patient does not need any further PT services  PT Problem List         PT Treatment Interventions      PT Goals (Current goals can be found in the Care Plan section)  Acute Rehab PT Goals Patient Stated Goal: get better, return to normal daily activities PT Goal Formulation: With patient Time For Goal Achievement: 04/27/21 Potential to Achieve Goals: Good    Frequency       Co-evaluation PT/OT/SLP Co-Evaluation/Treatment: Yes Reason for Co-Treatment: Other (comment) (use of interpreter services)           AM-PAC PT "6 Clicks" Mobility  Outcome Measure Help needed turning from your back to your side while in a flat bed without using bedrails?: None Help needed moving from lying on your back to sitting on the side of a flat bed without using bedrails?: None Help needed moving to and from a bed to a chair (including a wheelchair)?: None Help needed standing up from a chair using your arms (e.g., wheelchair or bedside chair)?: None Help needed to walk in hospital room?: None Help needed climbing 3-5 steps with a railing? : None 6 Click Score: 24    End of Session Equipment Utilized During Treatment: Gait belt Activity Tolerance: Patient tolerated treatment well Patient left: in bed;with family/visitor present;with call bell/phone within reach Nurse Communication: Mobility status      Time: 1749-4496 PT Time Calculation (min) (ACUTE ONLY): 30 min   Charges:   PT Evaluation $PT Eval Low Complexity: 1 Low PT  Treatments $Therapeutic Activity: 8-22 mins        Ian Moore, PT, DPT 04/13/21, 12:23 PM   Ian Moore 04/13/2021, 12:20 PM

## 2021-04-13 NOTE — Evaluation (Addendum)
Occupational Therapy Evaluation Patient Details Name: Ian Moore MRN: [id number] DOB: 1966/03/30 Today's Date: 04/13/2021   History of Present Illness Pt is a 56 y/o M admitted on 04/11/21 with R hemorrhagic thalamic stroke in the setting of hypertensive emergency/uncontrolled HTN, requiring nicardipine infusion, after pt experienced L sided weakness/numbness. PMH: uncontrolled HTN (previously treated but recently taken off meds 3 months ago for unclear reasons), former smoker   Clinical Impression   Pt seen for OT evaluation this date in setting of acute hospitalization d/t CVA. Pt reports being INDEP at baseline including working. He presents this date with no focal deficits. He is able to complete all aspects of mobility and ADLs with no assist and appears to be at his functional baseline. He is educated on stroke s/s (BEFAST) and importance of monitoring BP QD. Pt and family with good understanding. No further acute OT needs detected, nor need for f/u.  NOTE: BP this session BP checked in RUE at beginning of session: 175/104 mmHg MAP 123 BP checked in LUE with larger, more appropriate fitting cuff, supine: 178/97 mmHg MAP 119 Nurse administered IV BP medication & ~1 minute later BP in RUE: 114/76 mmHg MAP 89 BP at end of session, sitting EOB: 134/72 mmHg MAP 91     Recommendations for follow up therapy are one component of a multi-disciplinary discharge planning process, led by the attending physician.  Recommendations may be updated based on patient status, additional functional criteria and insurance authorization.   Follow Up Recommendations  No OT follow up    Assistance Recommended at Discharge None  Patient can return home with the following      Functional Status Assessment  Patient has not had a recent decline in their functional status  Equipment Recommendations  None recommended by OT    Recommendations for Other Services       Precautions / Restrictions  Precautions Precautions: None Restrictions Weight Bearing Restrictions: No      Mobility Bed Mobility Overal bed mobility: Modified Independent                  Transfers Overall transfer level: Independent Equipment used: None               General transfer comment: sit>stand without AD      Balance Overall balance assessment: No apparent balance deficits (not formally assessed)                                         ADL either performed or assessed with clinical judgement   ADL Overall ADL's : Independent                                             Vision Patient Visual Report: No change from baseline       Perception     Praxis      Pertinent Vitals/Pain Pain Assessment: No/denies pain     Hand Dominance Right   Extremity/Trunk Assessment Upper Extremity Assessment Upper Extremity Assessment: Overall WFL for tasks assessed (numbness resolved by time of OT assessment)   Lower Extremity Assessment Lower Extremity Assessment: Overall WFL for tasks assessed       Communication Communication Communication: Interpreter utilized (Pt is Spanish speaking. Utilized in-person interpreter, [name], during session)  Cognition Arousal/Alertness: Awake/alert Behavior During Therapy: WFL for tasks assessed/performed Overall Cognitive Status: Within Functional Limits for tasks assessed                                       General Comments  ed with pt and family re: monitoring BP QD to help mitigate stroke risks.    Exercises Other Exercises Other Exercises: OT ed with pt and family re: CVA risk factors, prevention, potential impacts on ADLs, s/s. Pt and family with good understanding and ammenable to monitoring pressure.   Shoulder Instructions      Home Living Family/patient expects to be discharged to:: Private residence Living Arrangements: Spouse/significant  other;Children Available Help at Discharge: Family;Available 24 hours/day Type of Home: House Home Access: Level entry     Home Layout: One level               Home Equipment: None          Prior Functioning/Environment Prior Level of Function : Independent/Modified Independent;Working/employed;Driving             Mobility Comments: Driving, working in active occupation Tree surgeon)          OT Problem List: Decreased knowledge of precautions      OT Treatment/Interventions: Self-care/ADL training    OT Goals(Current goals can be found in the care plan section) Acute Rehab OT Goals Patient Stated Goal: to go home OT Goal Formulation: All assessment and education complete, DC therapy  OT Frequency:      Co-evaluation PT/OT/SLP Co-Evaluation/Treatment: Yes Reason for Co-Treatment: Complexity of the patient's impairments (multi-system involvement) PT goals addressed during session: Mobility/safety with mobility OT goals addressed during session: ADL's and self-care      AM-PAC OT "6 Clicks" Daily Activity     Outcome Measure Help from another person eating meals?: None Help from another person taking care of personal grooming?: None Help from another person toileting, which includes using toliet, bedpan, or urinal?: None Help from another person bathing (including washing, rinsing, drying)?: None Help from another person to put on and taking off regular upper body clothing?: None Help from another person to put on and taking off regular lower body clothing?: None 6 Click Score: 24   End of Session Nurse Communication: Mobility status  Activity Tolerance: Patient tolerated treatment well Patient left: Other (comment) (seated EOB talking to family)  OT Visit Diagnosis: Other symptoms and signs involving the nervous system (Y51.102)                Time: 1117-3567 OT Time Calculation (min): 28 min Charges:  OT General Charges $OT Visit: 1  Visit OT Evaluation $OT Eval Low Complexity: [place], [place], OTR/L ascom [phone] 04/13/21, 4:10 PM

## 2021-04-13 NOTE — Consult Note (Signed)
[facility] for Electrolyte Monitoring and Replacement   Recent Labs: Potassium (mmol/L)  Date Value  04/13/2021 2.9 (L)   Magnesium (mg/dL)  Date Value  04/13/2021 2.3   Calcium (mg/dL)  Date Value  04/13/2021 8.9   Albumin (g/dL)  Date Value  04/12/2021 3.9  01/12/2017 4.6   Phosphorus (mg/dL)  Date Value  04/13/2021 4.1   Sodium (mmol/L)  Date Value  04/13/2021 134 (L)  01/12/2017 143   Assessment: Patient is a 56 y/o M with medical history including hypertension, medication non-compliance, OSA who is admitted with hypertensive emergency complicated by intraparenchymal hemorrhage. Pharmacy consulted to assist with electrolyte monitoring and replacement as indicated.  Goal of Therapy:  Electrolytes within normal limits  Plan:  --K 2.9, NP ordered KCL 10 meq IV x 4 and Kcl 40 mEq PO x 1 -on Hydrochlorothiazide 12.5mg  BID -F/u K at 1800 --Follow-up electrolytes with AM labs   Addalynne Golding A 04/13/2021 9:50 AM

## 2021-04-13 NOTE — Progress Notes (Signed)
Report called to RN on 1C, POC continued and all questions answered.  Pt transferred via the bed and family at bedside at time of transfer.  All belongings with pt.

## 2021-04-13 NOTE — Progress Notes (Signed)
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 Weaned off of nicardipine drip. Continue on home dose of amlodipine, HCTZ. Hydralazine prn. Neuro following and recs apprec   HTN: poorly controlled. Continue on home dose of amlodipine, HCTZ  HLD: continue on statin   Hypokalemia: KCl repleated.   Pre-DM: HbA1c 5.9. Continue on SSI w/ accuchecks  Obesity: BMI 31.8. Complicates overall care & prognosis   DVT prophylaxis: SCDs Code Status:  full  Family Communication: discussed pt's care w/ pt's family at bedside and answered their questions  Disposition Plan: likely d/c back home   Level of care: Stepdown  Status is: Inpatient  Remains inpatient appropriate because: severity of illness    Consultants:  Neuro   Procedures:   Antimicrobials:    Subjective: Pt c/o fatigue   Objective: Vitals:   04/13/21 0500 04/13/21 0530 04/13/21 0600 04/13/21 0630  BP: (!) 123/97 114/85 118/78 140/89  Pulse: 68 72 69 70  Resp: 20 18 14  (!) 21  Temp:      TempSrc:      SpO2: 99% 99% 92% 98%  Weight: 86.8 kg     Height:        Intake/Output Summary (Last 24 hours) at 04/13/2021 0826 Last data filed at 04/12/2021 2145 Gross per 24 hour  Intake 1193.67 ml  Output 600 ml  Net 593.67 ml   Filed Weights   04/11/21 1039 04/13/21 0500  Weight: 88.5 kg 86.8 kg    Examination:  General exam: Appears calm and comfortable  Respiratory system: Clear to auscultation. Respiratory effort normal. Cardiovascular system: S1 & S2 +. No rubs, gallops or clicks. Gastrointestinal system: Abdomen is  obese, soft and nontender. Normal bowel sounds heard. Central nervous system: Alert and oriented. Moves all extremities  Psychiatry: Judgement and insight appear  normal. Appropriate mood and affect     Data Reviewed: I have personally reviewed following labs and imaging studies  CBC: Recent Labs  Lab 04/11/21 1041 04/12/21 0946 04/13/21 0438  WBC 8.0 9.6 8.5  NEUTROABS 5.5  --   --   HGB 14.7 15.1 14.9  HCT 42.1 42.9 42.4  MCV 83.5 82.5 83.0  PLT 184 207 076   Basic Metabolic Panel: Recent Labs  Lab 04/11/21 1041 04/12/21 0620 04/12/21 0946 04/13/21 0438  NA 136 137 135 134*  K 2.7* 3.0* 3.4* 2.9*  CL 102 104 103 101  CO2 23 25 26 25   GLUCOSE 155* 120* 120* 139*  BUN 17 11 11 16   CREATININE 0.95 0.66 0.71 0.82  CALCIUM 8.9 8.3* 8.5* 8.9  MG  --  2.2  --  2.3  PHOS  --  2.1*  --  4.1   GFR: Estimated Creatinine Clearance: 101.9 mL/min (by C-G formula based on SCr of 0.82 mg/dL). Liver Function Tests: Recent Labs  Lab 04/11/21 1041 04/12/21 0620  AST 33  --   ALT 37  --   ALKPHOS 49  --   BILITOT 0.9  --   PROT 8.0  --   ALBUMIN 3.9 3.9   No results for input(s): LIPASE, AMYLASE in the last 168 hours. No results for input(s): AMMONIA in the last 168 hours. Coagulation Profile: Recent Labs  Lab 04/11/21 1041  INR 1.1   Cardiac Enzymes: No  results for input(s): CKTOTAL, CKMB, CKMBINDEX, TROPONINI in the last 168 hours. BNP (last 3 results) No results for input(s): PROBNP in the last 8760 hours. HbA1C: Recent Labs    04/11/21 1041  HGBA1C 5.9*   CBG: No results for input(s): GLUCAP in the last 168 hours. Lipid Profile: Recent Labs    04/11/21 1041  CHOL 181  HDL 48  LDLCALC 105*  TRIG 142  CHOLHDL 3.8   Thyroid Function Tests: Recent Labs    04/11/21 1041  TSH 1.662   Anemia Panel: No results for input(s): VITAMINB12, FOLATE, FERRITIN, TIBC, IRON, RETICCTPCT in the last 72 hours. Sepsis Labs: No results for input(s): PROCALCITON, LATICACIDVEN in the last 168 hours.  Recent Results (from the past 240 hour(s))  Resp Panel by RT-PCR (Flu A&B, Covid) Nasopharyngeal Swab     Status: None    Collection Time: 04/11/21 12:38 PM   Specimen: Nasopharyngeal Swab; Nasopharyngeal(NP) swabs in vial transport medium  Result Value Ref Range Status   SARS Coronavirus 2 by RT PCR NEGATIVE NEGATIVE Final    Comment: (NOTE) SARS-CoV-2 target nucleic acids are NOT DETECTED.  The SARS-CoV-2 RNA is generally detectable in upper respiratory specimens during the acute phase of infection. The lowest concentration of SARS-CoV-2 viral copies this assay can detect is 138 copies/mL. A negative result does not preclude SARS-Cov-2 infection and should not be used as the sole basis for treatment or other patient management decisions. A negative result may occur with  improper specimen collection/handling, submission of specimen other than nasopharyngeal swab, presence of viral mutation(s) within the areas targeted by this assay, and inadequate number of viral copies(<138 copies/mL). A negative result must be combined with clinical observations, patient history, and epidemiological information. The expected result is Negative.  Fact Sheet for Patients:  EntrepreneurPulse.com.au  Fact Sheet for Healthcare Providers:  IncredibleEmployment.be  This test is no t yet approved or cleared by the [place] FDA and  has been authorized for detection and/or diagnosis of SARS-CoV-2 by FDA under an Emergency Use Authorization (EUA). This EUA will remain  in effect (meaning this test can be used) for the duration of the COVID-19 declaration under Section 564(b)(1) of the Act, 21 U.S.C.section 360bbb-3(b)(1), unless the authorization is terminated  or revoked sooner.       Influenza A by PCR NEGATIVE NEGATIVE Final   Influenza B by PCR NEGATIVE NEGATIVE Final    Comment: (NOTE) The Xpert Xpress SARS-CoV-2/FLU/RSV plus assay is intended as an aid in the diagnosis of influenza from Nasopharyngeal swab specimens and should not be used as a sole basis for treatment.  Nasal washings and aspirates are unacceptable for Xpert Xpress SARS-CoV-2/FLU/RSV testing.  Fact Sheet for Patients: EntrepreneurPulse.com.au  Fact Sheet for Healthcare Providers: IncredibleEmployment.be  This test is not yet approved or cleared by the [place] FDA and has been authorized for detection and/or diagnosis of SARS-CoV-2 by FDA under an Emergency Use Authorization (EUA). This EUA will remain in effect (meaning this test can be used) for the duration of the COVID-19 declaration under Section 564(b)(1) of the Act, 21 U.S.C. section 360bbb-3(b)(1), unless the authorization is terminated or revoked.  Performed at [facility], [address], [place], [place] [address]          Radiology Studies: DG Chest 1 View  Result Date: 04/11/2021 CLINICAL DATA:  Stroke. EXAM: CHEST  1 VIEW COMPARISON:  None. FINDINGS: Lung volumes are low. Heart is normal in size. There is mild aortic tortuosity. Pulmonary  vasculature is normal. No consolidation, pleural effusion, or pneumothorax. No acute osseous abnormalities are seen. IMPRESSION: Low lung volumes without acute abnormality. Electronically Signed   By: Keith Rake M.D.   On: 04/11/2021 22:51   CT HEAD WO CONTRAST (5MM)  Result Date: 04/11/2021 CLINICAL DATA:  Stroke follow-up EXAM: CT HEAD WITHOUT CONTRAST TECHNIQUE: Contiguous axial images were obtained from the base of the skull through the vertex without intravenous contrast. RADIATION DOSE REDUCTION: This exam was performed according to the departmental dose-optimization program which includes automated exposure control, adjustment of the mA and/or kV according to patient size and/or use of iterative reconstruction technique. COMPARISON:  CT examination performed earlier on the same date at 11:02 a.m. FINDINGS: Brain: 8 x 6 x 8 mm parenchymal hemorrhage in the right thalamus with mild surrounding edema is unchanged. No new  hemorrhage. No evidence of hydrocephalus, extra-axial collection. Vascular: No hyperdense vessel or unexpected calcification. Skull: Normal. Negative for fracture or focal lesion. Sinuses/Orbits: No acute finding. Other: None. IMPRESSION: Stable right thalamic acute intraparenchymal hemorrhage with mild surrounding edema. No new hemorrhage. No hydrocephalus or midline shift. Electronically Signed   By: Keane Police D.O.   On: 04/11/2021 17:25   CT HEAD WO CONTRAST  Result Date: 04/11/2021 CLINICAL DATA:  Stroke suspected, left-sided numbness EXAM: CT HEAD WITHOUT CONTRAST TECHNIQUE: Contiguous axial images were obtained from the base of the skull through the vertex without intravenous contrast. RADIATION DOSE REDUCTION: This exam was performed according to the departmental dose-optimization program which includes automated exposure control, adjustment of the mA and/or kV according to patient size and/or use of iterative reconstruction technique. COMPARISON:  No prior CT head, correlation is made with CT orbits 01/21/2017. FINDINGS: Brain: Hyperdense focus in the right lateral thalamus (series 3, image 15), measuring approximately 5 x 8 x 8 mm, concerning for acute hemorrhage. Mild surrounding hypodensity, likely edema. No significant associated mass effect or midline shift. No evidence of intraventricular extension no evidence of acute infarction or mass. No hydrocephalus or extra-axial fluid collection. Vascular: No hyperdense vessel. Skull: Normal. Negative for fracture or focal lesion. Sinuses/Orbits: No acute finding. Other: The mastoid air cells are well aerated. IMPRESSION: IMPRESSION Hyperdense focus in the right lateral thalamus, concerning for acute intraparenchymal hemorrhage. These results were called by telephone at the time of interpretation on 04/11/2021 at 11:10 am to provider [facility] , who verbally acknowledged these results. Electronically Signed   By: Merilyn Baba M.D.   On: 04/11/2021  11:10   MR ANGIO HEAD WO CONTRAST  Result Date: 04/12/2021 CLINICAL DATA:  Follow-up examination for acute stroke. EXAM: MRI HEAD WITHOUT CONTRAST MRA HEAD WITHOUT CONTRAST MRA NECK WITHOUT CONTRAST TECHNIQUE: Multiplanar, multiecho pulse sequences of the brain and surrounding structures were obtained without intravenous contrast. Angiographic images of the Circle of Willis were obtained using MRA technique without intravenous contrast. Angiographic images of the neck were obtained using MRA technique without intravenous contrast. Carotid stenosis measurements (when applicable) are obtained utilizing NASCET criteria, using the distal internal carotid diameter as the denominator. COMPARISON:  Prior CT from 04/11/2021. FINDINGS: MRI HEAD FINDINGS Brain: Cerebral volume within normal limits. Patchy T2/FLAIR hyperintensity involving the periventricular deep white matter both cerebral hemispheres most consistent with chronic small vessel ischemic disease, mild in nature. Few small remote lacunar infarcts present at the left anterior corpus callosum, bilateral basal ganglia, left thalamus, and pons. Previously identified acute intraparenchymal hemorrhage centered at the right thalamus again seen, not significantly changed in size measuring up  to 9 mm. Minimal surrounding vasogenic edema without significant regional mass effect. No intraventricular extension. 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 No other acute intracranial hemorrhage elsewhere within the brain. Few additional chronic micro hemorrhages noted about the cerebellum and thalami, most like related to chronic poorly controlled hypertension. No mass lesion, midline shift or mass effect. No hydrocephalus or extra-axial fluid collection. Pituitary gland suprasellar region normal. Vascular: Major intracranial vascular flow voids are maintained. Skull and upper cervical spine: Craniocervical junction within normal  limits. Bone marrow signal intensity normal. No scalp soft tissue abnormality. Sinuses/Orbits: Globes orbital soft tissues demonstrate no acute finding. 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 Mild scattered mucosal thickening noted within the ethmoidal air cells and maxillary sinuses. No mastoid effusion. Inner ear structures grossly normal. Other: None. MRA HEAD FINDINGS ANTERIOR CIRCULATION: Visualized distal cervical segments of the internal carotid arteries are patent with antegrade flow. Petrous, cavernous, and supraclinoid segments patent without stenosis or other abnormality. A1 segments patent bilaterally. Normal anterior communicating artery complex. Severe atheromatous disease seen involving the right greater than left A2/A3 segments with associated moderate to severe multifocal stenoses (series 1045, image 12). ACAs remain patent to their distal aspects. No M1 stenosis or occlusion. Normal MCA bifurcations. Distal MCA branches perfused and symmetric. POSTERIOR CIRCULATION: Vertebral arteries are largely codominant widely patent to the vertebrobasilar junction. Left PICA patent. Right PICA not seen. Dominant right AICA. Basilar patent to its distal aspect without stenosis. Superior cerebral arteries patent bilaterally. Right PCA supplied via the basilar. Left PCA supplied via the basilar as well as a robust left posterior communicating artery. Both PCAs widely patent and well perfused to their distal aspects. No intracranial aneurysm. MRA NECK FINDINGS AORTIC ARCH: Aortic arch and origin of the great vessels not visualized on this exam. RIGHT CAROTID SYSTEM: Visualized right CCA widely patent to the bifurcation without stenosis. No significant atheromatous narrowing about the right carotid bulb. Right ICA patent distally without stenosis or dissection. LEFT CAROTID SYSTEM: Visualized left CCA patent without stenosis. No significant atheromatous irregularity or narrowing about the  left carotid bulb. Visualized left ICA patent distally without stenosis or dissection. VERTEBRAL ARTERIES: Visualized vertebral arteries widely patent with antegrade flow. No evidence for dissection or stenosis. IMPRESSION: MRI HEAD: 1. Unchanged 9 mm acute intraparenchymal hemorrhage centered at the right thalamus. Minimal surrounding vasogenic edema without significant regional mass effect. 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 2. Few scattered chronic micro hemorrhages about the cerebellum and thalami, likely related to chronic poorly controlled hypertension. 3. Underlying mild chronic microvascular ischemic disease with multiple additional remote lacunar infarcts as above. MRA HEAD: 1. Negative intracranial MRA for large vessel occlusion. 2. Severe atheromatous disease involving the right greater than left A2/A3 segments with associated moderate to severe multifocal stenoses. 3. Otherwise wide patency of the major intracranial arterial vasculature. No other proximal high-grade or correctable stenosis. MRA NECK: Negative MRA of the neck with wide patency of both carotid artery systems and vertebral arteries. No flow-limiting stenosis or other acute vascular abnormality. Electronically Signed   By: Jeannine Boga M.D.   On: 04/12/2021 03:02   MR ANGIO NECK WO CONTRAST  Result Date: 04/12/2021 CLINICAL DATA:  Follow-up examination for acute stroke. EXAM: MRI HEAD WITHOUT CONTRAST MRA HEAD WITHOUT CONTRAST MRA NECK WITHOUT CONTRAST TECHNIQUE: Multiplanar, multiecho pulse sequences of the brain and surrounding structures were obtained without intravenous contrast. Angiographic images of the Circle  of Willis were obtained using MRA technique without intravenous contrast. Angiographic images of the neck were obtained using MRA technique without intravenous contrast. Carotid stenosis measurements (when applicable) are obtained utilizing NASCET criteria, using the  distal internal carotid diameter as the denominator. COMPARISON:  Prior CT from 04/11/2021. FINDINGS: MRI HEAD FINDINGS Brain: Cerebral volume within normal limits. Patchy T2/FLAIR hyperintensity involving the periventricular deep white matter both cerebral hemispheres most consistent with chronic small vessel ischemic disease, mild in nature. Few small remote lacunar infarcts present at the left anterior corpus callosum, bilateral basal ganglia, left thalamus, and pons. Previously identified acute intraparenchymal hemorrhage centered at the right thalamus again seen, not significantly changed in size measuring up to 9 mm. Minimal surrounding vasogenic edema without significant regional mass effect. No intraventricular extension. 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 No other acute intracranial hemorrhage elsewhere within the brain. Few additional chronic micro hemorrhages noted about the cerebellum and thalami, most like related to chronic poorly controlled hypertension. No mass lesion, midline shift or mass effect. No hydrocephalus or extra-axial fluid collection. Pituitary gland suprasellar region normal. Vascular: Major intracranial vascular flow voids are maintained. Skull and upper cervical spine: Craniocervical junction within normal limits. Bone marrow signal intensity normal. No scalp soft tissue abnormality. Sinuses/Orbits: Globes orbital soft tissues demonstrate no acute finding. 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 Mild scattered mucosal thickening noted within the ethmoidal air cells and maxillary sinuses. No mastoid effusion. Inner ear structures grossly normal. Other: None. MRA HEAD FINDINGS ANTERIOR CIRCULATION: Visualized distal cervical segments of the internal carotid arteries are patent with antegrade flow. Petrous, cavernous, and supraclinoid segments patent without stenosis or other abnormality. A1 segments patent  bilaterally. Normal anterior communicating artery complex. Severe atheromatous disease seen involving the right greater than left A2/A3 segments with associated moderate to severe multifocal stenoses (series 1045, image 12). ACAs remain patent to their distal aspects. No M1 stenosis or occlusion. Normal MCA bifurcations. Distal MCA branches perfused and symmetric. POSTERIOR CIRCULATION: Vertebral arteries are largely codominant widely patent to the vertebrobasilar junction. Left PICA patent. Right PICA not seen. Dominant right AICA. Basilar patent to its distal aspect without stenosis. Superior cerebral arteries patent bilaterally. Right PCA supplied via the basilar. Left PCA supplied via the basilar as well as a robust left posterior communicating artery. Both PCAs widely patent and well perfused to their distal aspects. No intracranial aneurysm. MRA NECK FINDINGS AORTIC ARCH: Aortic arch and origin of the great vessels not visualized on this exam. RIGHT CAROTID SYSTEM: Visualized right CCA widely patent to the bifurcation without stenosis. No significant atheromatous narrowing about the right carotid bulb. Right ICA patent distally without stenosis or dissection. LEFT CAROTID SYSTEM: Visualized left CCA patent without stenosis. No significant atheromatous irregularity or narrowing about the left carotid bulb. Visualized left ICA patent distally without stenosis or dissection. VERTEBRAL ARTERIES: Visualized vertebral arteries widely patent with antegrade flow. No evidence for dissection or stenosis. IMPRESSION: MRI HEAD: 1. Unchanged 9 mm acute intraparenchymal hemorrhage centered at the right thalamus. Minimal surrounding vasogenic edema without significant regional mass effect. 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 2. Few scattered chronic micro hemorrhages about the cerebellum and thalami, likely related to chronic poorly controlled hypertension. 3. Underlying mild  chronic microvascular ischemic disease with multiple additional remote lacunar infarcts as above. MRA HEAD: 1. Negative intracranial MRA for large vessel occlusion. 2. Severe atheromatous disease involving the right greater than  left A2/A3 segments with associated moderate to severe multifocal stenoses. 3. Otherwise wide patency of the major intracranial arterial vasculature. No other proximal high-grade or correctable stenosis. MRA NECK: Negative MRA of the neck with wide patency of both carotid artery systems and vertebral arteries. No flow-limiting stenosis or other acute vascular abnormality. Electronically Signed   By: Jeannine Boga M.D.   On: 04/12/2021 03:02   MR BRAIN WO CONTRAST  Result Date: 04/12/2021 CLINICAL DATA:  Follow-up examination for acute stroke. EXAM: MRI HEAD WITHOUT CONTRAST MRA HEAD WITHOUT CONTRAST MRA NECK WITHOUT CONTRAST TECHNIQUE: Multiplanar, multiecho pulse sequences of the brain and surrounding structures were obtained without intravenous contrast. Angiographic images of the Circle of Willis were obtained using MRA technique without intravenous contrast. Angiographic images of the neck were obtained using MRA technique without intravenous contrast. Carotid stenosis measurements (when applicable) are obtained utilizing NASCET criteria, using the distal internal carotid diameter as the denominator. COMPARISON:  Prior CT from 04/11/2021. FINDINGS: MRI HEAD FINDINGS Brain: Cerebral volume within normal limits. Patchy T2/FLAIR hyperintensity involving the periventricular deep white matter both cerebral hemispheres most consistent with chronic small vessel ischemic disease, mild in nature. Few small remote lacunar infarcts present at the left anterior corpus callosum, bilateral basal ganglia, left thalamus, and pons. Previously identified acute intraparenchymal hemorrhage centered at the right thalamus again seen, not significantly changed in size measuring up to 9 mm. Minimal  surrounding vasogenic edema without significant regional mass effect. No intraventricular extension. 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 No other acute intracranial hemorrhage elsewhere within the brain. Few additional chronic micro hemorrhages noted about the cerebellum and thalami, most like related to chronic poorly controlled hypertension. No mass lesion, midline shift or mass effect. No hydrocephalus or extra-axial fluid collection. Pituitary gland suprasellar region normal. Vascular: Major intracranial vascular flow voids are maintained. Skull and upper cervical spine: Craniocervical junction within normal limits. Bone marrow signal intensity normal. No scalp soft tissue abnormality. Sinuses/Orbits: Globes orbital soft tissues demonstrate no acute finding. 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 Mild scattered mucosal thickening noted within the ethmoidal air cells and maxillary sinuses. No mastoid effusion. Inner ear structures grossly normal. Other: None. MRA HEAD FINDINGS ANTERIOR CIRCULATION: Visualized distal cervical segments of the internal carotid arteries are patent with antegrade flow. Petrous, cavernous, and supraclinoid segments patent without stenosis or other abnormality. A1 segments patent bilaterally. Normal anterior communicating artery complex. Severe atheromatous disease seen involving the right greater than left A2/A3 segments with associated moderate to severe multifocal stenoses (series 1045, image 12). ACAs remain patent to their distal aspects. No M1 stenosis or occlusion. Normal MCA bifurcations. Distal MCA branches perfused and symmetric. POSTERIOR CIRCULATION: Vertebral arteries are largely codominant widely patent to the vertebrobasilar junction. Left PICA patent. Right PICA not seen. Dominant right AICA. Basilar patent to its distal aspect without stenosis. Superior cerebral arteries patent bilaterally. Right  PCA supplied via the basilar. Left PCA supplied via the basilar as well as a robust left posterior communicating artery. Both PCAs widely patent and well perfused to their distal aspects. No intracranial aneurysm. MRA NECK FINDINGS AORTIC ARCH: Aortic arch and origin of the great vessels not visualized on this exam. RIGHT CAROTID SYSTEM: Visualized right CCA widely patent to the bifurcation without stenosis. No significant atheromatous narrowing about the right carotid bulb. Right ICA patent distally without stenosis or dissection. LEFT CAROTID SYSTEM: Visualized left CCA patent without stenosis. No significant atheromatous irregularity or narrowing  about the left carotid bulb. Visualized left ICA patent distally without stenosis or dissection. VERTEBRAL ARTERIES: Visualized vertebral arteries widely patent with antegrade flow. No evidence for dissection or stenosis. IMPRESSION: MRI HEAD: 1. Unchanged 9 mm acute intraparenchymal hemorrhage centered at the right thalamus. Minimal surrounding vasogenic edema without significant regional mass effect. 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 2. Few scattered chronic micro hemorrhages about the cerebellum and thalami, likely related to chronic poorly controlled hypertension. 3. Underlying mild chronic microvascular ischemic disease with multiple additional remote lacunar infarcts as above. MRA HEAD: 1. Negative intracranial MRA for large vessel occlusion. 2. Severe atheromatous disease involving the right greater than left A2/A3 segments with associated moderate to severe multifocal stenoses. 3. Otherwise wide patency of the major intracranial arterial vasculature. No other proximal high-grade or correctable stenosis. MRA NECK: Negative MRA of the neck with wide patency of both carotid artery systems and vertebral arteries. No flow-limiting stenosis or other acute vascular abnormality. Electronically Signed   By: Jeannine Boga M.D.   On: 04/12/2021 03:02   ECHOCARDIOGRAM COMPLETE  Result Date: 04/12/2021    ECHOCARDIOGRAM REPORT   Patient Name:   GILMER KAMINSKY Date of Exam: 04/12/2021 Medical Rec #:  [id number]       Height:       65.0 in Accession #:    [id number]      Weight:       195.0 lb Date of Birth:  09-06-1965       BSA:          1.957 m Patient Age:    14 years        BP:           153/91 mmHg Patient Gender: M               HR:           63 bpm. Exam Location:  [facility] Procedure: 2D Echo, Color Doppler, Cardiac Doppler and Intracardiac            Opacification Agent Indications:     I63.9 Stroke  History:         Patient has no prior history of Echocardiogram examinations.                  Risk Factors:Hypertension.  Sonographer:     Charmayne Sheer Referring Phys:  [id number] [name] Diagnosing Phys: Donnelly Angelica  Sonographer Comments: Suboptimal apical window and suboptimal subcostal window. Image acquisition challenging due to patient body habitus. IMPRESSIONS  1. Left ventricular ejection fraction, by estimation, is 60 to 65%. The left ventricle has normal function. The left ventricle has no regional wall motion abnormalities. Left ventricular diastolic parameters were normal.  2. Right ventricular systolic function was not well visualized. The right ventricular size is not well visualized.  3. The mitral valve is normal in structure. No evidence of mitral valve regurgitation. No evidence of mitral stenosis.  4. The aortic valve is normal in structure. Aortic valve regurgitation is not visualized. No aortic stenosis is present.  5. The inferior vena cava is normal in size with greater than 50% respiratory variability, suggesting right atrial pressure of 3 mmHg. FINDINGS  Left Ventricle: Left ventricular ejection fraction, by estimation, is 60 to 65%. The left ventricle has normal function. The left ventricle has no regional wall motion abnormalities. Definity contrast agent was given IV to delineate the left  ventricular  endocardial borders. The left ventricular  internal cavity size was normal in size. There is no left ventricular hypertrophy. Left ventricular diastolic parameters were normal. Right Ventricle: The right ventricular size is not well visualized. Right vetricular wall thickness was not well visualized. Right ventricular systolic function was not well visualized. Left Atrium: Left atrial size was not well visualized. Right Atrium: Right atrial size was not well visualized. Pericardium: There is no evidence of pericardial effusion. Mitral Valve: The mitral valve is normal in structure. No evidence of mitral valve regurgitation. No evidence of mitral valve stenosis. MV peak gradient, 3.4 mmHg. The mean mitral valve gradient is 1.0 mmHg. Tricuspid Valve: The tricuspid valve is normal in structure. Tricuspid valve regurgitation is not demonstrated. No evidence of tricuspid stenosis. Aortic Valve: The aortic valve is normal in structure. Aortic valve regurgitation is not visualized. No aortic stenosis is present. Aortic valve mean gradient measures 7.0 mmHg. Aortic valve peak gradient measures 11.2 mmHg. Aortic valve area, by VTI measures 2.58 cm. Pulmonic Valve: The pulmonic valve was not well visualized. Pulmonic valve regurgitation is not visualized. No evidence of pulmonic stenosis. Aorta: The aortic root is normal in size and structure. Venous: The inferior vena cava is normal in size with greater than 50% respiratory variability, suggesting right atrial pressure of 3 mmHg. IAS/Shunts: No atrial level shunt detected by color flow Doppler.  LEFT VENTRICLE PLAX 2D LVIDd:         4.84 cm   Diastology LVIDs:         3.82 cm   LV e' medial:    7.07 cm/s LV PW:         1.24 cm   LV E/e' medial:  12.7 LV IVS:        0.92 cm   LV e' lateral:   8.70 cm/s LVOT diam:     2.10 cm   LV E/e' lateral: 10.3 LV SV:         84 LV SV Index:   43 LVOT Area:     3.46 cm  RIGHT VENTRICLE RV Basal diam:  2.65 cm LEFT ATRIUM          Index LA diam:    3.90 cm 1.99 cm/m  AORTIC VALVE                     PULMONIC VALVE AV Area (Vmax):    2.61 cm      PV Vmax:       1.14 m/s AV Area (Vmean):   2.55 cm      PV Vmean:      77.700 cm/s AV Area (VTI):     2.58 cm      PV VTI:        0.203 m AV Vmax:           167.00 cm/s   PV Peak grad:  5.2 mmHg AV Vmean:          125.000 cm/s  PV Mean grad:  3.0 mmHg AV VTI:            0.325 m AV Peak Grad:      11.2 mmHg AV Mean Grad:      7.0 mmHg LVOT Vmax:         126.00 cm/s LVOT Vmean:        92.200 cm/s LVOT VTI:          0.242 m LVOT/AV VTI ratio: 0.74  AORTA Ao Root diam: 3.40 cm MITRAL VALVE MV Area (PHT):  2.86 cm    SHUNTS MV Area VTI:   2.74 cm    Systemic VTI:  0.24 m MV Peak grad:  3.4 mmHg    Systemic Diam: 2.10 cm MV Mean grad:  1.0 mmHg MV Vmax:       0.92 m/s MV Vmean:      56.1 cm/s MV Decel Time: 265 msec MV E velocity: 89.50 cm/s MV A velocity: 72.40 cm/s MV E/A ratio:  1.24 Donnelly Angelica Electronically signed by Donnelly Angelica Signature Date/Time: 04/12/2021/3:34:11 PM    Final         Scheduled Meds:   stroke: mapping our early stages of recovery book   Does not apply Once   amLODipine  10 mg Oral Daily   atorvastatin  80 mg Oral Daily   Chlorhexidine Gluconate Cloth  6 each Topical Daily   hydrochlorothiazide  12.5 mg Oral BID   pantoprazole  40 mg Oral QHS   senna-docusate  1 tablet Oral BID   Continuous Infusions:  niCARDipine Stopped (04/12/21 1502)   potassium chloride 10 mEq (04/13/21 0749)     LOS: 2 days    Time spent: 25 mins    Wyvonnia Dusky, MD [facility] Pager [phone]  If 7PM-7AM, please contact night-coverage 04/13/2021, 8:26 AM

## 2021-04-13 NOTE — Plan of Care (Addendum)
Neurology Plan of care  Attempted to see patient in ED however patient had been moved looked for patient in ICU 17A however not there.  Recommendations: - Repeat CT (ordered) and if hemorrhage is stable he may start aspirin 81mg  daily. - Maximize statin for goal LDL <70. - A1c 5.9 is at goal. - BP goal <140/90 and ideally <130/90. - He may benefit from a CPAP titration study. - His wife will need a work excuse for 04/11/2021 to 04/12/2021. - He will also need a work excuse on discharge.

## 2021-04-14 ENCOUNTER — Inpatient Hospital Stay: Payer: Commercial Managed Care - PPO

## 2021-04-14 DIAGNOSIS — E7849 Other hyperlipidemia: Secondary | ICD-10-CM

## 2021-04-14 LAB — MAGNESIUM: Magnesium: 2.2 mg/dL (ref 1.7–2.4)

## 2021-04-14 LAB — BASIC METABOLIC PANEL
Anion gap: 7 (ref 5–15)
BUN: 24 mg/dL — ABNORMAL HIGH (ref 6–20)
CO2: 24 mmol/L (ref 22–32)
Calcium: 8.7 mg/dL — ABNORMAL LOW (ref 8.9–10.3)
Chloride: 103 mmol/L (ref 98–111)
Creatinine, Ser: 1.2 mg/dL (ref 0.61–1.24)
GFR, Estimated: >60 mL/min (ref >60)
Glucose, Bld: 127 mg/dL — ABNORMAL HIGH (ref 70–99)
Potassium: 3.6 mmol/L (ref 3.5–5.1)
Sodium: 134 mmol/L — ABNORMAL LOW (ref 135–145)

## 2021-04-14 IMAGING — CT CT HEAD W/O CM
4 series · 17 of 47 positions shown, 19 images · non-contrast
Comparison: Three days ago

CLINICAL DATA: Stroke follow-up, assess for hemorrhage.



[Series 2: head wo · axial · 0.46mm/px · z∈[-134,-14]mm · 7 of 33 slices shown, 9 images]
[im 5/33  brain]
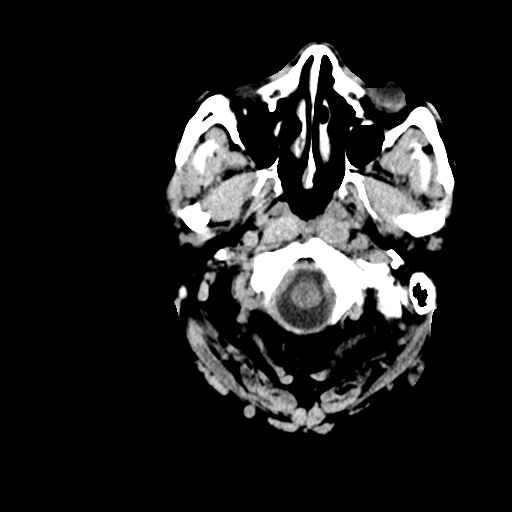
[im 5/33  bone]
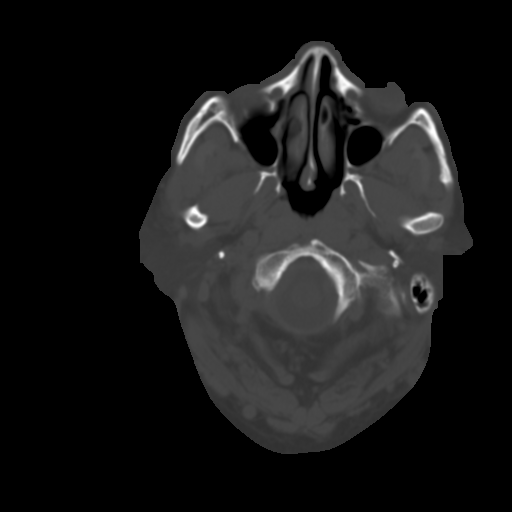
[im 9/33  brain]
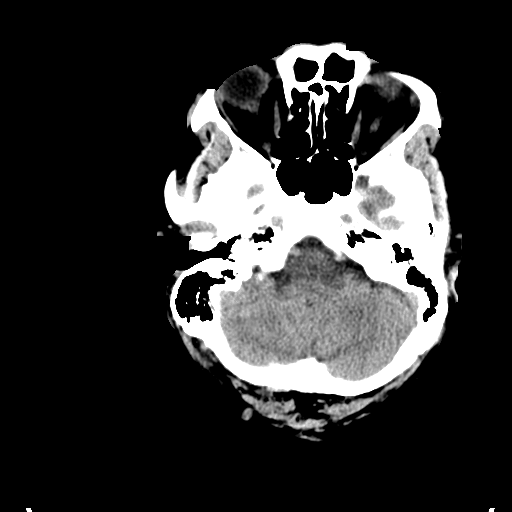
[im 13/33  brain]
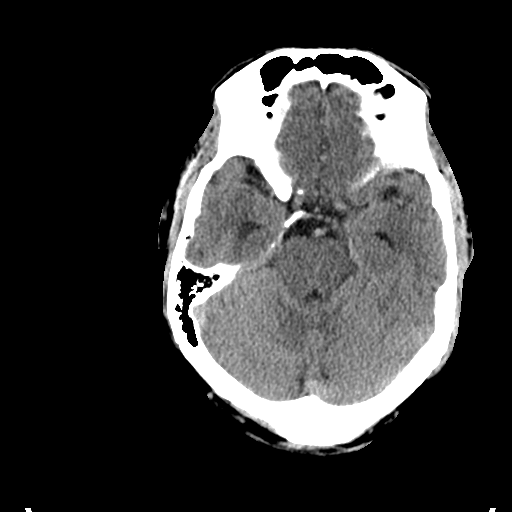
[im 17/33  brain]
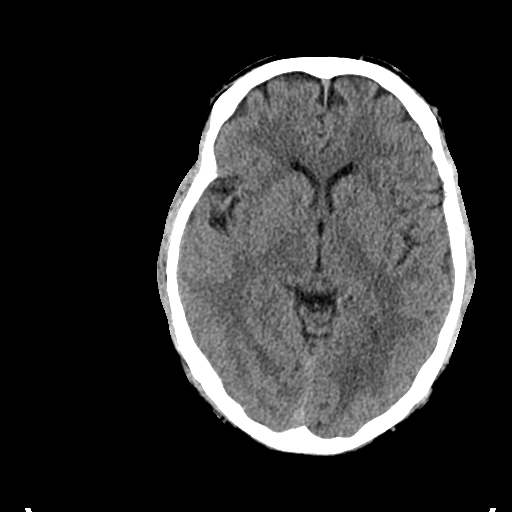
[im 21/33  brain]
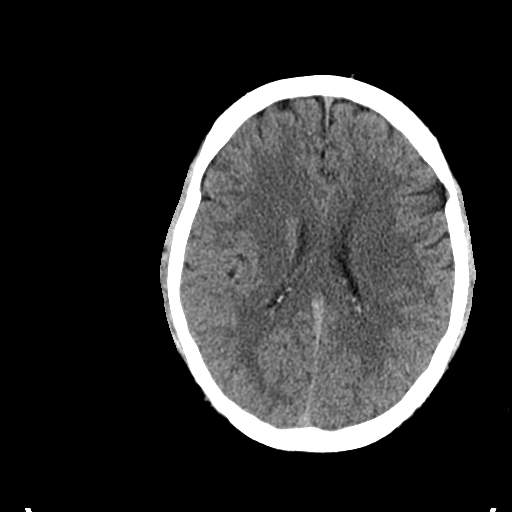
[im 21/33  bone]
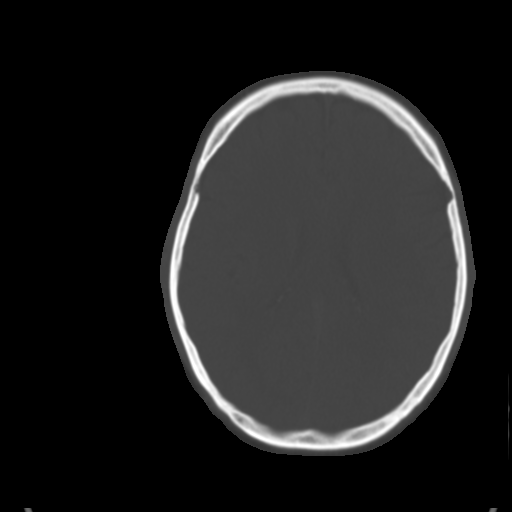
[im 25/33  brain]
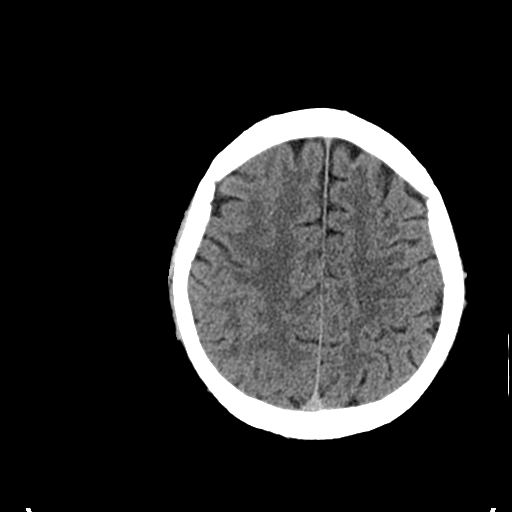
[im 29/33  brain]
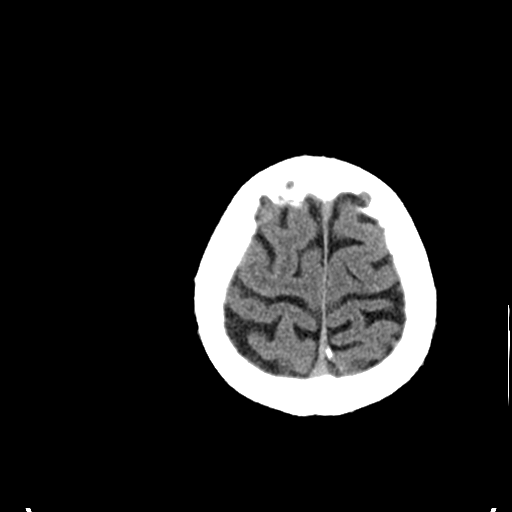

[Series 3: head bone · axial · 0.46mm/px · z∈[-138,-82]mm · 4 of 81 slices shown]
[im 9/81  bone]
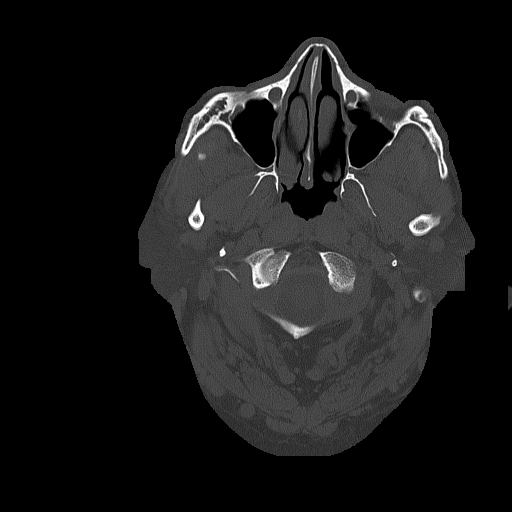
[im 17/81  bone]
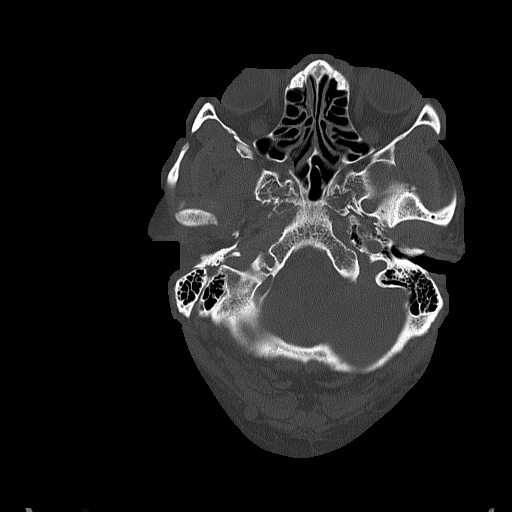
[im 25/81  bone]
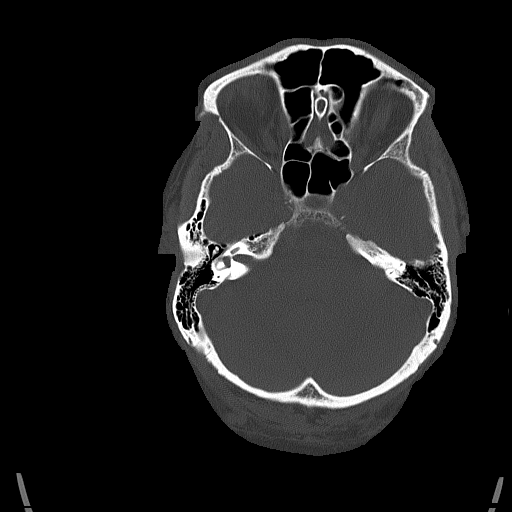
[im 37/81  bone]
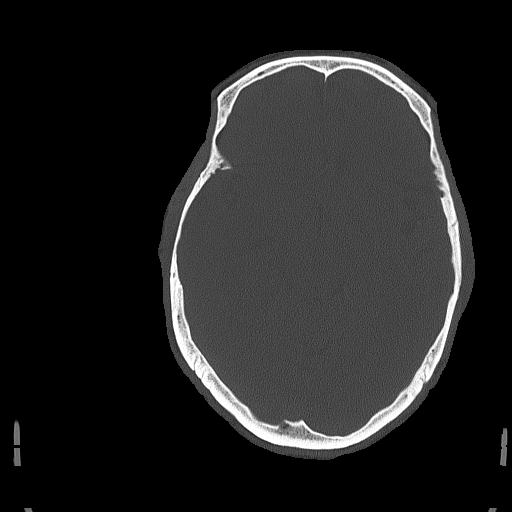

[Series 4: coronal soft tissue · coronal · 0.35mm/px · 3 of 69 slices shown]
[im 23/69  brain]
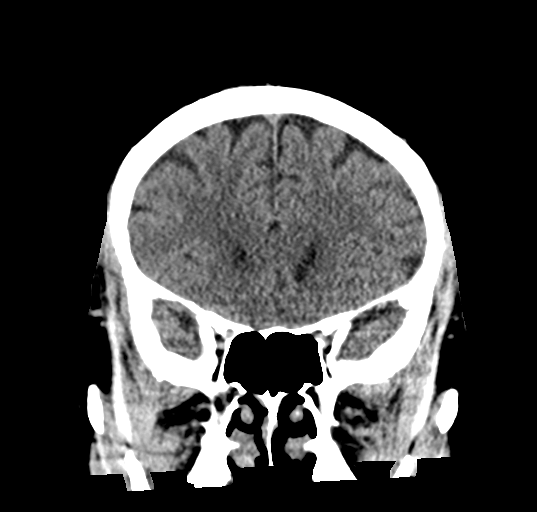
[im 31/69  brain]
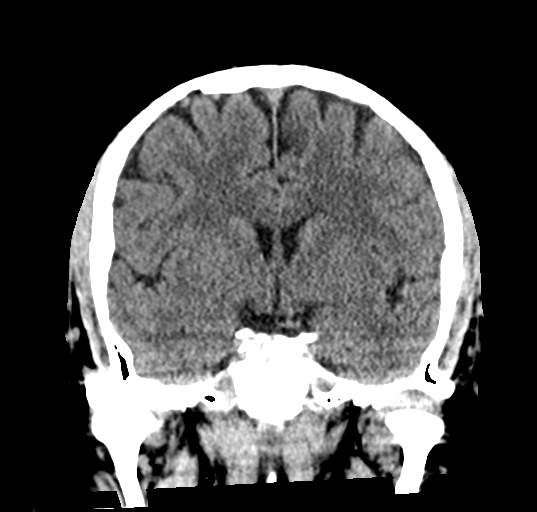
[im 38/69  brain]
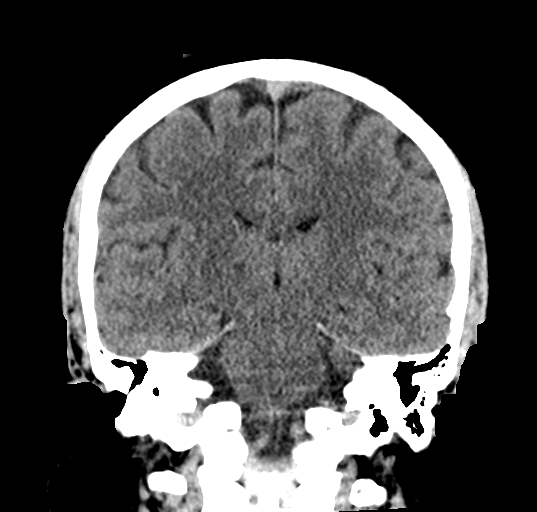

[Series 5: sagittal soft tissue · sagittal · 0.35mm/px · 3 of 64 slices shown]
[im 22/64  brain]
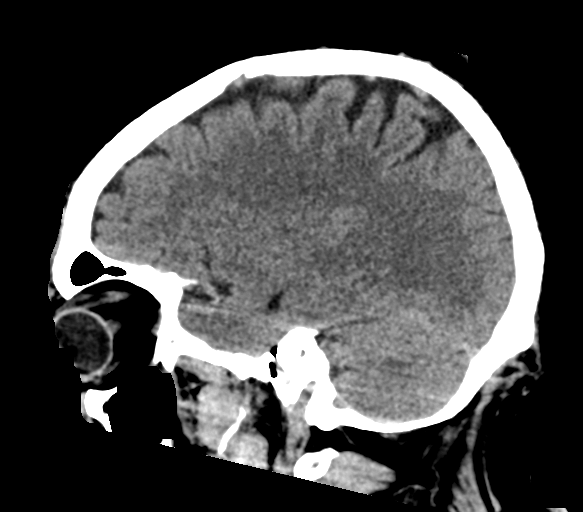
[im 32/64  brain]
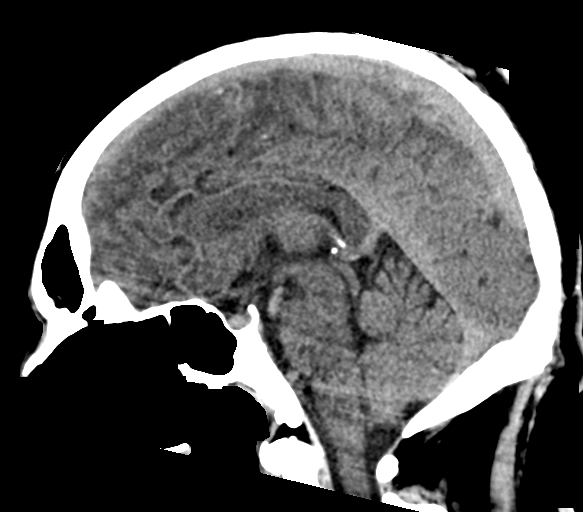
[im 43/64  brain]
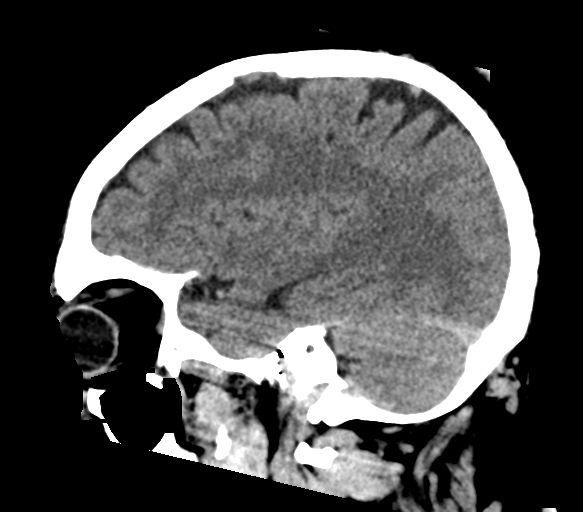

[17 of 47 positions shown; findings below may reference images not displayed]

FINDINGS: Brain: Unchanged 9 mm hemorrhage at the right thalamus capsular
junction. No cortical infarct, hydrocephalus, or masslike finding.

Vascular: No hyperdense vessel or unexpected calcification.

Skull: Normal. Negative for fracture or focal lesion.

Sinuses/Orbits: No acute finding.
IMPRESSION: Unchanged 9 mm hemorrhage at the right thalamocapsular junction.

## 2021-04-14 MED ORDER — HYDROCHLOROTHIAZIDE 25 MG PO TABS: 25.0000 mg | ORAL_TABLET | Freq: Every day | ORAL | 0 refills | Status: DC

## 2021-04-14 MED ORDER — ASPIRIN EC 81 MG PO TBEC
81.0000 mg | DELAYED_RELEASE_TABLET | Freq: Every day | ORAL | Status: DC
  Administered 2021-04-14: 81 mg via ORAL
  Filled 2021-04-14: qty 1

## 2021-04-14 MED ORDER — AMLODIPINE BESYLATE 10 MG PO TABS: 10.0000 mg | ORAL_TABLET | Freq: Every day | ORAL | 0 refills | Status: DC

## 2021-04-14 MED ORDER — ASPIRIN 81 MG PO TBEC: 81.0000 mg | DELAYED_RELEASE_TABLET | Freq: Every day | ORAL | 0 refills | Status: AC

## 2021-04-14 MED ORDER — ATORVASTATIN CALCIUM 80 MG PO TABS: 80.0000 mg | ORAL_TABLET | Freq: Every day | ORAL | 0 refills | Status: DC

## 2021-04-14 NOTE — Discharge Summary (Signed)
Physician Discharge Summary  Ian Moore:[id number] DOB: 04-24-1965 DOA: 04/11/2021  PCP: Olin Hauser, DO  Admit date: 04/11/2021 Discharge date: 04/14/2021  Admitted From: home  Disposition: home  Recommendations for Outpatient Follow-up:  Follow up with PCP in 1-2 weeks F/u w/ neurology in 1-2 weeks   Home Health: no  Equipment/Devices:  Discharge Condition: stable  CODE STATUS: full  Diet recommendation: Heart Healthy / Carb Modified  Brief/Interim Summary: HPI was taken from NP Ian Moore: 56 y.o with significant PMH of uncontrolled hypertension (previously treated but was recently taken off medications  x 3 months for unclear reasons), and former smoker who presented to the ED with chief complaints of left-sided weakness and numbness.   Patient report that he went to bed around 2000 last night and woke up with left sided numbness.  Denies associated altered sensorium, speech abnormality, cranial nerve deficit, seizures, focal motor deficits, diplopia, nausea or vomiting, syncope or LOC. Patient state he went to work but symptoms persistent so he called his daughter who advised him to come to the ED for further evaluation.   ED Course: On arrival to the ED, he was afebrile with blood pressure 224/114 mm Hg and pulse rate 81 beats/min. There were no focal neurological deficits; he was alert and oriented x4, and he did not demonstrate any memory deficits. Complete blood count (CBC), thyroid-stimulating hormone (TSH) -reflex, urinalysis, drug of abuse screen were unremarkable other than comprehensive metabolic panel (CMP) which showed hypokalemia and elevated blood glucose levels of 155; an insidious infectious workup was initiated, including  SARS-CoV-2 PCR and Influenza PCR that were ultimately negative. ECG showed sinus rhythm of 51 beats per minute and the chest X-ray was normal. A non-contrast head CT showed an acute right thalamic intraparenchymal hemorrhage with mild  surrounding edema. Patient was evaluated by in house Neurologist at the bedside. Initial NIHSS: 1, ICH score: 0. PCCM consulted for admission to ICU for close monitoring.  As per NP Dewaine Conger: 1/12: Admitted to the ICU with acute right thalamic hemorrhagic stroke 1/13: Follow up imaging shows stable right thalamic stroke.  Will resume home Norvasc and HCTZ in order to wean Nicardipine infusion.  As per Dr. Jimmye Norman 1/14-1/15/23: PT/OT evaluated the pt and no further therapy was recommended. Repeat CT head showed stable hemorrhagic thalamic CVA. Pt was started on aspirin 81 mg daily & statin as per cardio. Pt will continue on amlodipine, HCTZ. Pt will need to f/u w/ neurology in 1-2 weeks as well as PCP in 1-2 weeks. Pt verbalized his understanding. For more information, please see previous progress/consult notes.    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 Weaned off of nicardipine drip. Repeat CT head shows stable hemorrhagic CVA. Continue on home amlodipine, HCTZ, aspirin & statin. Hydralazine prn. Neuro following and recs apprec    HTN: poorly controlled. Continue on home dose of amlodipine, HCTZ   HLD: continue on statin    Hypokalemia: WNL today    Pre-DM: HbA1c 5.9. Continue on SSI w/ accuchecks   Obesity: BMI 31.8. Complicates overall care & prognosis   Discharge Instructions  Discharge Instructions     Diet - low sodium heart healthy   Complete by: As directed    Discharge instructions   Complete by: As directed    F/u w/ PCP in 1-2 weeks. F/u w/ neurology in 1-2 weeks.   Increase activity slowly   Complete  by: As directed       Allergies as of 04/14/2021       Reactions   Influenza A (h1n1) Monoval Vac Rash   Penicillins Rash   Has patient had a PCN reaction causing immediate rash, facial/tongue/throat swelling, SOB or lightheadedness with hypotension:  No Has patient had a PCN reaction causing severe rash involving mucus membranes or skin necrosis: No Has patient had a PCN reaction that required hospitalization: No Has patient had a PCN reaction occurring within the last 10 years: No If all of the above answers are "NO", then may proceed with Cephalosporin use.        Medication List     TAKE these medications    amLODipine 10 MG tablet Commonly known as: NORVASC Take 1 tablet (10 mg total) by mouth daily.   aspirin 81 MG EC tablet Take 1 tablet (81 mg total) by mouth daily. Swallow whole.   atorvastatin 80 MG tablet Commonly known as: LIPITOR Take 1 tablet (80 mg total) by mouth daily. Start taking on: April 15, 2021   hydrochlorothiazide 25 MG tablet Commonly known as: HYDRODIURIL Take 1 tablet (25 mg total) by mouth daily. What changed:  medication strength how much to take        Allergies  Allergen Reactions   Influenza A (H1n1) Monoval Vac Rash   Penicillins Rash    Has patient had a PCN reaction causing immediate rash, facial/tongue/throat swelling, SOB or lightheadedness with hypotension: No Has patient had a PCN reaction causing severe rash involving mucus membranes or skin necrosis: No Has patient had a PCN reaction that required hospitalization: No Has patient had a PCN reaction occurring within the last 10 years: No If all of the above answers are "NO", then may proceed with Cephalosporin use.     Consultations: Neuro ICU    Procedures/Studies: DG Chest 1 View  Result Date: 04/11/2021 CLINICAL DATA:  Stroke. EXAM: CHEST  1 VIEW COMPARISON:  None. FINDINGS: Lung volumes are low. Heart is normal in size. There is mild aortic tortuosity. Pulmonary vasculature is normal. No consolidation, pleural effusion, or pneumothorax. No acute osseous abnormalities are seen. IMPRESSION: Low lung volumes without acute abnormality. Electronically Signed   By: Keith Rake M.D.   On: 04/11/2021 22:51   CT  HEAD WO CONTRAST (5MM)  Result Date: 04/14/2021 CLINICAL DATA:  Stroke follow-up, assess for hemorrhage. EXAM: CT HEAD WITHOUT CONTRAST TECHNIQUE: Contiguous axial images were obtained from the base of the skull through the vertex without intravenous contrast. RADIATION DOSE REDUCTION: This exam was performed according to the departmental dose-optimization program which includes automated exposure control, adjustment of the mA and/or kV according to patient size and/or use of iterative reconstruction technique. COMPARISON:  Three days ago FINDINGS: Brain: Unchanged 9 mm hemorrhage at the right thalamus capsular junction. No cortical infarct, hydrocephalus, or masslike finding. Vascular: No hyperdense vessel or unexpected calcification. Skull: Normal. Negative for fracture or focal lesion. Sinuses/Orbits: No acute finding. IMPRESSION: Unchanged 9 mm hemorrhage at the right thalamocapsular junction. Electronically Signed   By: Jorje Guild M.D.   On: 04/14/2021 08:37   CT HEAD WO CONTRAST (5MM)  Result Date: 04/11/2021 CLINICAL DATA:  Stroke follow-up EXAM: CT HEAD WITHOUT CONTRAST TECHNIQUE: Contiguous axial images were obtained from the base of the skull through the vertex without intravenous contrast. RADIATION DOSE REDUCTION: This exam was performed according to the departmental dose-optimization program which includes automated exposure control, adjustment of the mA and/or kV according to  patient size and/or use of iterative reconstruction technique. COMPARISON:  CT examination performed earlier on the same date at 11:02 a.m. FINDINGS: Brain: 8 x 6 x 8 mm parenchymal hemorrhage in the right thalamus with mild surrounding edema is unchanged. No new hemorrhage. No evidence of hydrocephalus, extra-axial collection. Vascular: No hyperdense vessel or unexpected calcification. Skull: Normal. Negative for fracture or focal lesion. Sinuses/Orbits: No acute finding. Other: None. IMPRESSION: Stable right thalamic  acute intraparenchymal hemorrhage with mild surrounding edema. No new hemorrhage. No hydrocephalus or midline shift. Electronically Signed   By: Keane Police D.O.   On: 04/11/2021 17:25   CT HEAD WO CONTRAST  Result Date: 04/11/2021 CLINICAL DATA:  Stroke suspected, left-sided numbness EXAM: CT HEAD WITHOUT CONTRAST TECHNIQUE: Contiguous axial images were obtained from the base of the skull through the vertex without intravenous contrast. RADIATION DOSE REDUCTION: This exam was performed according to the departmental dose-optimization program which includes automated exposure control, adjustment of the mA and/or kV according to patient size and/or use of iterative reconstruction technique. COMPARISON:  No prior CT head, correlation is made with CT orbits 01/21/2017. FINDINGS: Brain: Hyperdense focus in the right lateral thalamus (series 3, image 15), measuring approximately 5 x 8 x 8 mm, concerning for acute hemorrhage. Mild surrounding hypodensity, likely edema. No significant associated mass effect or midline shift. No evidence of intraventricular extension no evidence of acute infarction or mass. No hydrocephalus or extra-axial fluid collection. Vascular: No hyperdense vessel. Skull: Normal. Negative for fracture or focal lesion. Sinuses/Orbits: No acute finding. Other: The mastoid air cells are well aerated. IMPRESSION: IMPRESSION Hyperdense focus in the right lateral thalamus, concerning for acute intraparenchymal hemorrhage. These results were called by telephone at the time of interpretation on 04/11/2021 at 11:10 am to provider [facility] , who verbally acknowledged these results. Electronically Signed   By: Merilyn Baba M.D.   On: 04/11/2021 11:10   MR ANGIO HEAD WO CONTRAST  Result Date: 04/12/2021 CLINICAL DATA:  Follow-up examination for acute stroke. EXAM: MRI HEAD WITHOUT CONTRAST MRA HEAD WITHOUT CONTRAST MRA NECK WITHOUT CONTRAST TECHNIQUE: Multiplanar, multiecho pulse sequences of the brain  and surrounding structures were obtained without intravenous contrast. Angiographic images of the Circle of Willis were obtained using MRA technique without intravenous contrast. Angiographic images of the neck were obtained using MRA technique without intravenous contrast. Carotid stenosis measurements (when applicable) are obtained utilizing NASCET criteria, using the distal internal carotid diameter as the denominator. COMPARISON:  Prior CT from 04/11/2021. FINDINGS: MRI HEAD FINDINGS Brain: Cerebral volume within normal limits. Patchy T2/FLAIR hyperintensity involving the periventricular deep white matter both cerebral hemispheres most consistent with chronic small vessel ischemic disease, mild in nature. Few small remote lacunar infarcts present at the left anterior corpus callosum, bilateral basal ganglia, left thalamus, and pons. Previously identified acute intraparenchymal hemorrhage centered at the right thalamus again seen, not significantly changed in size measuring up to 9 mm. Minimal surrounding vasogenic edema without significant regional mass effect. No intraventricular extension. 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 No other acute intracranial hemorrhage elsewhere within the brain. Few additional chronic micro hemorrhages noted about the cerebellum and thalami, most like related to chronic poorly controlled hypertension. No mass lesion, midline shift or mass effect. No hydrocephalus or extra-axial fluid collection. Pituitary gland suprasellar region normal. Vascular: Major intracranial vascular flow voids are maintained. Skull and upper cervical spine: Craniocervical junction within normal limits. Bone marrow signal intensity normal. No scalp soft tissue  abnormality. Sinuses/Orbits: Globes orbital soft tissues demonstrate no acute finding. 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 Mild scattered mucosal thickening noted within the  ethmoidal air cells and maxillary sinuses. No mastoid effusion. Inner ear structures grossly normal. Other: None. MRA HEAD FINDINGS ANTERIOR CIRCULATION: Visualized distal cervical segments of the internal carotid arteries are patent with antegrade flow. Petrous, cavernous, and supraclinoid segments patent without stenosis or other abnormality. A1 segments patent bilaterally. Normal anterior communicating artery complex. Severe atheromatous disease seen involving the right greater than left A2/A3 segments with associated moderate to severe multifocal stenoses (series 1045, image 12). ACAs remain patent to their distal aspects. No M1 stenosis or occlusion. Normal MCA bifurcations. Distal MCA branches perfused and symmetric. POSTERIOR CIRCULATION: Vertebral arteries are largely codominant widely patent to the vertebrobasilar junction. Left PICA patent. Right PICA not seen. Dominant right AICA. Basilar patent to its distal aspect without stenosis. Superior cerebral arteries patent bilaterally. Right PCA supplied via the basilar. Left PCA supplied via the basilar as well as a robust left posterior communicating artery. Both PCAs widely patent and well perfused to their distal aspects. No intracranial aneurysm. MRA NECK FINDINGS AORTIC ARCH: Aortic arch and origin of the great vessels not visualized on this exam. RIGHT CAROTID SYSTEM: Visualized right CCA widely patent to the bifurcation without stenosis. No significant atheromatous narrowing about the right carotid bulb. Right ICA patent distally without stenosis or dissection. LEFT CAROTID SYSTEM: Visualized left CCA patent without stenosis. No significant atheromatous irregularity or narrowing about the left carotid bulb. Visualized left ICA patent distally without stenosis or dissection. VERTEBRAL ARTERIES: Visualized vertebral arteries widely patent with antegrade flow. No evidence for dissection or stenosis. IMPRESSION: MRI HEAD: 1. Unchanged 9 mm acute  intraparenchymal hemorrhage centered at the right thalamus. Minimal surrounding vasogenic edema without significant regional mass effect. 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 2. Few scattered chronic micro hemorrhages about the cerebellum and thalami, likely related to chronic poorly controlled hypertension. 3. Underlying mild chronic microvascular ischemic disease with multiple additional remote lacunar infarcts as above. MRA HEAD: 1. Negative intracranial MRA for large vessel occlusion. 2. Severe atheromatous disease involving the right greater than left A2/A3 segments with associated moderate to severe multifocal stenoses. 3. Otherwise wide patency of the major intracranial arterial vasculature. No other proximal high-grade or correctable stenosis. MRA NECK: Negative MRA of the neck with wide patency of both carotid artery systems and vertebral arteries. No flow-limiting stenosis or other acute vascular abnormality. Electronically Signed   By: Jeannine Boga M.D.   On: 04/12/2021 03:02   MR ANGIO NECK WO CONTRAST  Result Date: 04/12/2021 CLINICAL DATA:  Follow-up examination for acute stroke. EXAM: MRI HEAD WITHOUT CONTRAST MRA HEAD WITHOUT CONTRAST MRA NECK WITHOUT CONTRAST TECHNIQUE: Multiplanar, multiecho pulse sequences of the brain and surrounding structures were obtained without intravenous contrast. Angiographic images of the Circle of Willis were obtained using MRA technique without intravenous contrast. Angiographic images of the neck were obtained using MRA technique without intravenous contrast. Carotid stenosis measurements (when applicable) are obtained utilizing NASCET criteria, using the distal internal carotid diameter as the denominator. COMPARISON:  Prior CT from 04/11/2021. FINDINGS: MRI HEAD FINDINGS Brain: Cerebral volume within normal limits. Patchy T2/FLAIR hyperintensity involving the periventricular deep white matter both cerebral  hemispheres most consistent with chronic small vessel ischemic disease, mild in nature. Few small remote lacunar infarcts present at the left anterior corpus callosum, bilateral basal ganglia, left thalamus, and pons.  Previously identified acute intraparenchymal hemorrhage centered at the right thalamus again seen, not significantly changed in size measuring up to 9 mm. Minimal surrounding vasogenic edema without significant regional mass effect. No intraventricular extension. 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 No other acute intracranial hemorrhage elsewhere within the brain. Few additional chronic micro hemorrhages noted about the cerebellum and thalami, most like related to chronic poorly controlled hypertension. No mass lesion, midline shift or mass effect. No hydrocephalus or extra-axial fluid collection. Pituitary gland suprasellar region normal. Vascular: Major intracranial vascular flow voids are maintained. Skull and upper cervical spine: Craniocervical junction within normal limits. Bone marrow signal intensity normal. No scalp soft tissue abnormality. Sinuses/Orbits: Globes orbital soft tissues demonstrate no acute finding. 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 Mild scattered mucosal thickening noted within the ethmoidal air cells and maxillary sinuses. No mastoid effusion. Inner ear structures grossly normal. Other: None. MRA HEAD FINDINGS ANTERIOR CIRCULATION: Visualized distal cervical segments of the internal carotid arteries are patent with antegrade flow. Petrous, cavernous, and supraclinoid segments patent without stenosis or other abnormality. A1 segments patent bilaterally. Normal anterior communicating artery complex. Severe atheromatous disease seen involving the right greater than left A2/A3 segments with associated moderate to severe multifocal stenoses (series 1045, image 12). ACAs remain patent to their distal  aspects. No M1 stenosis or occlusion. Normal MCA bifurcations. Distal MCA branches perfused and symmetric. POSTERIOR CIRCULATION: Vertebral arteries are largely codominant widely patent to the vertebrobasilar junction. Left PICA patent. Right PICA not seen. Dominant right AICA. Basilar patent to its distal aspect without stenosis. Superior cerebral arteries patent bilaterally. Right PCA supplied via the basilar. Left PCA supplied via the basilar as well as a robust left posterior communicating artery. Both PCAs widely patent and well perfused to their distal aspects. No intracranial aneurysm. MRA NECK FINDINGS AORTIC ARCH: Aortic arch and origin of the great vessels not visualized on this exam. RIGHT CAROTID SYSTEM: Visualized right CCA widely patent to the bifurcation without stenosis. No significant atheromatous narrowing about the right carotid bulb. Right ICA patent distally without stenosis or dissection. LEFT CAROTID SYSTEM: Visualized left CCA patent without stenosis. No significant atheromatous irregularity or narrowing about the left carotid bulb. Visualized left ICA patent distally without stenosis or dissection. VERTEBRAL ARTERIES: Visualized vertebral arteries widely patent with antegrade flow. No evidence for dissection or stenosis. IMPRESSION: MRI HEAD: 1. Unchanged 9 mm acute intraparenchymal hemorrhage centered at the right thalamus. Minimal surrounding vasogenic edema without significant regional mass effect. 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 2. Few scattered chronic micro hemorrhages about the cerebellum and thalami, likely related to chronic poorly controlled hypertension. 3. Underlying mild chronic microvascular ischemic disease with multiple additional remote lacunar infarcts as above. MRA HEAD: 1. Negative intracranial MRA for large vessel occlusion. 2. Severe atheromatous disease involving the right greater than left A2/A3 segments with  associated moderate to severe multifocal stenoses. 3. Otherwise wide patency of the major intracranial arterial vasculature. No other proximal high-grade or correctable stenosis. MRA NECK: Negative MRA of the neck with wide patency of both carotid artery systems and vertebral arteries. No flow-limiting stenosis or other acute vascular abnormality. Electronically Signed   By: Jeannine Boga M.D.   On: 04/12/2021 03:02   MR BRAIN WO CONTRAST  Result Date: 04/12/2021 CLINICAL DATA:  Follow-up examination for acute stroke. EXAM: MRI HEAD WITHOUT CONTRAST MRA HEAD WITHOUT CONTRAST MRA NECK WITHOUT CONTRAST TECHNIQUE: Multiplanar, multiecho  pulse sequences of the brain and surrounding structures were obtained without intravenous contrast. Angiographic images of the Circle of Willis were obtained using MRA technique without intravenous contrast. Angiographic images of the neck were obtained using MRA technique without intravenous contrast. Carotid stenosis measurements (when applicable) are obtained utilizing NASCET criteria, using the distal internal carotid diameter as the denominator. COMPARISON:  Prior CT from 04/11/2021. FINDINGS: MRI HEAD FINDINGS Brain: Cerebral volume within normal limits. Patchy T2/FLAIR hyperintensity involving the periventricular deep white matter both cerebral hemispheres most consistent with chronic small vessel ischemic disease, mild in nature. Few small remote lacunar infarcts present at the left anterior corpus callosum, bilateral basal ganglia, left thalamus, and pons. Previously identified acute intraparenchymal hemorrhage centered at the right thalamus again seen, not significantly changed in size measuring up to 9 mm. Minimal surrounding vasogenic edema without significant regional mass effect. No intraventricular extension. 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 No other acute intracranial hemorrhage elsewhere within the brain. Few  additional chronic micro hemorrhages noted about the cerebellum and thalami, most like related to chronic poorly controlled hypertension. No mass lesion, midline shift or mass effect. No hydrocephalus or extra-axial fluid collection. Pituitary gland suprasellar region normal. Vascular: Major intracranial vascular flow voids are maintained. Skull and upper cervical spine: Craniocervical junction within normal limits. Bone marrow signal intensity normal. No scalp soft tissue abnormality. Sinuses/Orbits: Globes orbital soft tissues demonstrate no acute finding. 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 Mild scattered mucosal thickening noted within the ethmoidal air cells and maxillary sinuses. No mastoid effusion. Inner ear structures grossly normal. Other: None. MRA HEAD FINDINGS ANTERIOR CIRCULATION: Visualized distal cervical segments of the internal carotid arteries are patent with antegrade flow. Petrous, cavernous, and supraclinoid segments patent without stenosis or other abnormality. A1 segments patent bilaterally. Normal anterior communicating artery complex. Severe atheromatous disease seen involving the right greater than left A2/A3 segments with associated moderate to severe multifocal stenoses (series 1045, image 12). ACAs remain patent to their distal aspects. No M1 stenosis or occlusion. Normal MCA bifurcations. Distal MCA branches perfused and symmetric. POSTERIOR CIRCULATION: Vertebral arteries are largely codominant widely patent to the vertebrobasilar junction. Left PICA patent. Right PICA not seen. Dominant right AICA. Basilar patent to its distal aspect without stenosis. Superior cerebral arteries patent bilaterally. Right PCA supplied via the basilar. Left PCA supplied via the basilar as well as a robust left posterior communicating artery. Both PCAs widely patent and well perfused to their distal aspects. No intracranial aneurysm. MRA NECK FINDINGS AORTIC ARCH: Aortic  arch and origin of the great vessels not visualized on this exam. RIGHT CAROTID SYSTEM: Visualized right CCA widely patent to the bifurcation without stenosis. No significant atheromatous narrowing about the right carotid bulb. Right ICA patent distally without stenosis or dissection. LEFT CAROTID SYSTEM: Visualized left CCA patent without stenosis. No significant atheromatous irregularity or narrowing about the left carotid bulb. Visualized left ICA patent distally without stenosis or dissection. VERTEBRAL ARTERIES: Visualized vertebral arteries widely patent with antegrade flow. No evidence for dissection or stenosis. IMPRESSION: MRI HEAD: 1. Unchanged 9 mm acute intraparenchymal hemorrhage centered at the right thalamus. Minimal surrounding vasogenic edema without significant regional mass effect. 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 2. Few scattered chronic micro hemorrhages about the cerebellum and thalami, likely related to chronic poorly controlled hypertension. 3. Underlying mild chronic microvascular ischemic disease with multiple additional remote lacunar infarcts as above. MRA HEAD:  1. Negative intracranial MRA for large vessel occlusion. 2. Severe atheromatous disease involving the right greater than left A2/A3 segments with associated moderate to severe multifocal stenoses. 3. Otherwise wide patency of the major intracranial arterial vasculature. No other proximal high-grade or correctable stenosis. MRA NECK: Negative MRA of the neck with wide patency of both carotid artery systems and vertebral arteries. No flow-limiting stenosis or other acute vascular abnormality. Electronically Signed   By: Jeannine Boga M.D.   On: 04/12/2021 03:02   ECHOCARDIOGRAM COMPLETE  Result Date: 04/12/2021    ECHOCARDIOGRAM REPORT   Patient Name:   Ian Moore Date of Exam: 04/12/2021 Medical Rec #:  [id number]       Height:       65.0 in Accession #:    [id number]       Weight:       195.0 lb Date of Birth:  [birth year]       BSA:          1.957 m Patient Age:    56 years        BP:           153/91 mmHg Patient Gender: M               HR:           63 bpm. Exam Location:  [facility] Procedure: 2D Echo, Color Doppler, Cardiac Doppler and Intracardiac            Opacification Agent Indications:     I63.9 Stroke  History:         Patient has no prior history of Echocardiogram examinations.                  Risk Factors:Hypertension.  Sonographer:     Charmayne Sheer Referring Phys:  [id number] [facility] Ian Moore Diagnosing Phys: Donnelly Angelica  Sonographer Comments: Suboptimal apical window and suboptimal subcostal window. Image acquisition challenging due to patient body habitus. IMPRESSIONS  1. Left ventricular ejection fraction, by estimation, is 60 to 65%. The left ventricle has normal function. The left ventricle has no regional wall motion abnormalities. Left ventricular diastolic parameters were normal.  2. Right ventricular systolic function was not well visualized. The right ventricular size is not well visualized.  3. The mitral valve is normal in structure. No evidence of mitral valve regurgitation. No evidence of mitral stenosis.  4. The aortic valve is normal in structure. Aortic valve regurgitation is not visualized. No aortic stenosis is present.  5. The inferior vena cava is normal in size with greater than 50% respiratory variability, suggesting right atrial pressure of 3 mmHg. FINDINGS  Left Ventricle: Left ventricular ejection fraction, by estimation, is 60 to 65%. The left ventricle has normal function. The left ventricle has no regional wall motion abnormalities. Definity contrast agent was given IV to delineate the left ventricular  endocardial borders. The left ventricular internal cavity size was normal in size. There is no left ventricular hypertrophy. Left ventricular diastolic parameters were normal. Right Ventricle: The right ventricular size is not well visualized.  Right vetricular wall thickness was not well visualized. Right ventricular systolic function was not well visualized. Left Atrium: Left atrial size was not well visualized. Right Atrium: Right atrial size was not well visualized. Pericardium: There is no evidence of pericardial effusion. Mitral Valve: The mitral valve is normal in structure. No evidence of mitral valve regurgitation. No evidence of mitral valve stenosis. MV peak gradient, 3.4 mmHg. The mean mitral valve  gradient is 1.0 mmHg. Tricuspid Valve: The tricuspid valve is normal in structure. Tricuspid valve regurgitation is not demonstrated. No evidence of tricuspid stenosis. Aortic Valve: The aortic valve is normal in structure. Aortic valve regurgitation is not visualized. No aortic stenosis is present. Aortic valve mean gradient measures 7.0 mmHg. Aortic valve peak gradient measures 11.2 mmHg. Aortic valve area, by VTI measures 2.58 cm. Pulmonic Valve: The pulmonic valve was not well visualized. Pulmonic valve regurgitation is not visualized. No evidence of pulmonic stenosis. Aorta: The aortic root is normal in size and structure. Venous: The inferior vena cava is normal in size with greater than 50% respiratory variability, suggesting right atrial pressure of 3 mmHg. IAS/Shunts: No atrial level shunt detected by color flow Doppler.  LEFT VENTRICLE PLAX 2D LVIDd:         4.84 cm   Diastology LVIDs:         3.82 cm   LV e' medial:    7.07 cm/s LV PW:         1.24 cm   LV E/e' medial:  12.7 LV IVS:        0.92 cm   LV e' lateral:   8.70 cm/s LVOT diam:     2.10 cm   LV E/e' lateral: 10.3 LV SV:         84 LV SV Index:   43 LVOT Area:     3.46 cm  RIGHT VENTRICLE RV Basal diam:  2.65 cm LEFT ATRIUM         Index LA diam:    3.90 cm 1.99 cm/m  AORTIC VALVE                     PULMONIC VALVE AV Area (Vmax):    2.61 cm      PV Vmax:       1.14 m/s AV Area (Vmean):   2.55 cm      PV Vmean:      77.700 cm/s AV Area (VTI):     2.58 cm      PV VTI:         0.203 m AV Vmax:           167.00 cm/s   PV Peak grad:  5.2 mmHg AV Vmean:          125.000 cm/s  PV Mean grad:  3.0 mmHg AV VTI:            0.325 m AV Peak Grad:      11.2 mmHg AV Mean Grad:      7.0 mmHg LVOT Vmax:         126.00 cm/s LVOT Vmean:        92.200 cm/s LVOT VTI:          0.242 m LVOT/AV VTI ratio: 0.74  AORTA Ao Root diam: 3.40 cm MITRAL VALVE MV Area (PHT): 2.86 cm    SHUNTS MV Area VTI:   2.74 cm    Systemic VTI:  0.24 m MV Peak grad:  3.4 mmHg    Systemic Diam: 2.10 cm MV Mean grad:  1.0 mmHg MV Vmax:       0.92 m/s MV Vmean:      56.1 cm/s MV Decel Time: 265 msec MV E velocity: 89.50 cm/s MV A velocity: 72.40 cm/s MV E/A ratio:  1.24 Donnelly Angelica Electronically signed by Donnelly Angelica Signature Date/Time: 04/12/2021/3:34:11 PM    Final    (Echo, Carotid, EGD, Colonoscopy,  ERCP)    Subjective: Pt denies any complaints    Discharge Exam: Vitals:   04/14/21 0511 04/14/21 0755  BP: 138/81 139/84  Pulse: 90 87  Resp: 18 16  Temp: 97.9 F (36.6 C) 98.6 F (37 C)  SpO2: 95% 96%   Vitals:   04/13/21 1951 04/13/21 2112 04/14/21 0511 04/14/21 0755  BP: (!) 144/85 137/71 138/81 139/84  Pulse: 90 99 90 87  Resp: 18  18 16   Temp: 99.1 F (37.3 C)  97.9 F (36.6 C) 98.6 F (37 C)  TempSrc: Oral  Oral Oral  SpO2: 96%  95% 96%  Weight:      Height:        General: Pt is alert, awake, not in acute distress Cardiovascular: S1/S2 +, no rubs, no gallops Respiratory: CTA bilaterally, no wheezing, no rhonchi Abdominal: Soft, NT, ND, bowel sounds + Extremities: no edema, no cyanosis    The results of significant diagnostics from this hospitalization (including imaging, microbiology, ancillary and laboratory) are listed below for reference.     Microbiology: Recent Results (from the past 240 hour(s))  Resp Panel by RT-PCR (Flu A&B, Covid) Nasopharyngeal Swab     Status: None   Collection Time: 04/11/21 12:38 PM   Specimen: Nasopharyngeal Swab; Nasopharyngeal(NP) swabs in vial  transport medium  Result Value Ref Range Status   SARS Coronavirus 2 by RT PCR NEGATIVE NEGATIVE Final    Comment: (NOTE) SARS-CoV-2 target nucleic acids are NOT DETECTED.  The SARS-CoV-2 RNA is generally detectable in upper respiratory specimens during the acute phase of infection. The lowest concentration of SARS-CoV-2 viral copies this assay can detect is 138 copies/mL. A negative result does not preclude SARS-Cov-2 infection and should not be used as the sole basis for treatment or other patient management decisions. A negative result may occur with  improper specimen collection/handling, submission of specimen other than nasopharyngeal swab, presence of viral mutation(s) within the areas targeted by this assay, and inadequate number of viral copies(<138 copies/mL). A negative result must be combined with clinical observations, patient history, and epidemiological information. The expected result is Negative.  Fact Sheet for Patients:  EntrepreneurPulse.com.au  Fact Sheet for Healthcare Providers:  IncredibleEmployment.be  This test is no t yet approved or cleared by the [place] FDA and  has been authorized for detection and/or diagnosis of SARS-CoV-2 by FDA under an Emergency Use Authorization (EUA). This EUA will remain  in effect (meaning this test can be used) for the duration of the COVID-19 declaration under Section 564(b)(1) of the Act, 21 U.S.C.section 360bbb-3(b)(1), unless the authorization is terminated  or revoked sooner.       Influenza A by PCR NEGATIVE NEGATIVE Final   Influenza B by PCR NEGATIVE NEGATIVE Final    Comment: (NOTE) The Xpert Xpress SARS-CoV-2/FLU/RSV plus assay is intended as an aid in the diagnosis of influenza from Nasopharyngeal swab specimens and should not be used as a sole basis for treatment. Nasal washings and aspirates are unacceptable for Xpert Xpress SARS-CoV-2/FLU/RSV testing.  Fact  Sheet for Patients: EntrepreneurPulse.com.au  Fact Sheet for Healthcare Providers: IncredibleEmployment.be  This test is not yet approved or cleared by the [place] FDA and has been authorized for detection and/or diagnosis of SARS-CoV-2 by FDA under an Emergency Use Authorization (EUA). This EUA will remain in effect (meaning this test can be used) for the duration of the COVID-19 declaration under Section 564(b)(1) of the Act, 21 U.S.C. section 360bbb-3(b)(1), unless the authorization is terminated or  revoked.  Performed at [facility], [address] [place], [place] [address]      Labs: BNP (last 3 results) No results for input(s): BNP in the last 8760 hours. Basic Metabolic Panel: Recent Labs  Lab 04/11/21 1041 04/12/21 0620 04/12/21 0946 04/13/21 0438 04/13/21 1813 04/14/21 0455  NA 136 137 135 134*  --  134*  K 2.7* 3.0* 3.4* 2.9* 3.6 3.6  CL 102 104 103 101  --  103  CO2 23 25 26 25   --  24  GLUCOSE 155* 120* 120* 139*  --  127*  BUN 17 11 11 16   --  24*  CREATININE 0.95 0.66 0.71 0.82  --  1.20  CALCIUM 8.9 8.3* 8.5* 8.9  --  8.7*  MG  --  2.2  --  2.3  --  2.2  PHOS  --  2.1*  --  4.1  --   --    Liver Function Tests: Recent Labs  Lab 04/11/21 1041 04/12/21 0620  AST 33  --   ALT 37  --   ALKPHOS 49  --   BILITOT 0.9  --   PROT 8.0  --   ALBUMIN 3.9 3.9   No results for input(s): LIPASE, AMYLASE in the last 168 hours. No results for input(s): AMMONIA in the last 168 hours. CBC: Recent Labs  Lab 04/11/21 1041 04/12/21 0946 04/13/21 0438  WBC 8.0 9.6 8.5  NEUTROABS 5.5  --   --   HGB 14.7 15.1 14.9  HCT 42.1 42.9 42.4  MCV 83.5 82.5 83.0  PLT 184 207 214   Cardiac Enzymes: No results for input(s): CKTOTAL, CKMB, CKMBINDEX, TROPONINI in the last 168 hours. BNP: Invalid input(s): POCBNP CBG: No results for input(s): GLUCAP in the last 168 hours. D-Dimer No results for input(s): DDIMER  in the last 72 hours. Hgb A1c Recent Labs    04/11/21 1041  HGBA1C 5.9*   Lipid Profile Recent Labs    04/11/21 1041  CHOL 181  HDL 48  LDLCALC 105*  TRIG 142  CHOLHDL 3.8   Thyroid function studies Recent Labs    04/11/21 1041  TSH 1.662   Anemia work up No results for input(s): VITAMINB12, FOLATE, FERRITIN, TIBC, IRON, RETICCTPCT in the last 72 hours. Urinalysis No results found for: COLORURINE, APPEARANCEUR, [place], [place], GLUCOSEU, [place], [place], [place], PROTEINUR, UROBILINOGEN, NITRITE, LEUKOCYTESUR Sepsis Labs Invalid input(s): PROCALCITONIN,  WBC,  LACTICIDVEN Microbiology Recent Results (from the past 240 hour(s))  Resp Panel by RT-PCR (Flu A&B, Covid) Nasopharyngeal Swab     Status: None   Collection Time: 04/11/21 12:38 PM   Specimen: Nasopharyngeal Swab; Nasopharyngeal(NP) swabs in vial transport medium  Result Value Ref Range Status   SARS Coronavirus 2 by RT PCR NEGATIVE NEGATIVE Final    Comment: (NOTE) SARS-CoV-2 target nucleic acids are NOT DETECTED.  The SARS-CoV-2 RNA is generally detectable in upper respiratory specimens during the acute phase of infection. The lowest concentration of SARS-CoV-2 viral copies this assay can detect is 138 copies/mL. A negative result does not preclude SARS-Cov-2 infection and should not be used as the sole basis for treatment or other patient management decisions. A negative result may occur with  improper specimen collection/handling, submission of specimen other than nasopharyngeal swab, presence of viral mutation(s) within the areas targeted by this assay, and inadequate number of viral copies(<138 copies/mL). A negative result must be combined with clinical observations, patient history, and epidemiological information. The expected result is Negative.  Fact Sheet for Patients:  EntrepreneurPulse.com.au  Fact Sheet for Healthcare Providers:   IncredibleEmployment.be  This test is no t yet approved or cleared by the [place] FDA and  has been authorized for detection and/or diagnosis of SARS-CoV-2 by FDA under an Emergency Use Authorization (EUA). This EUA will remain  in effect (meaning this test can be used) for the duration of the COVID-19 declaration under Section 564(b)(1) of the Act, 21 U.S.C.section 360bbb-3(b)(1), unless the authorization is terminated  or revoked sooner.       Influenza A by PCR NEGATIVE NEGATIVE Final   Influenza B by PCR NEGATIVE NEGATIVE Final    Comment: (NOTE) The Xpert Xpress SARS-CoV-2/FLU/RSV plus assay is intended as an aid in the diagnosis of influenza from Nasopharyngeal swab specimens and should not be used as a sole basis for treatment. Nasal washings and aspirates are unacceptable for Xpert Xpress SARS-CoV-2/FLU/RSV testing.  Fact Sheet for Patients: EntrepreneurPulse.com.au  Fact Sheet for Healthcare Providers: IncredibleEmployment.be  This test is not yet approved or cleared by the [place] FDA and has been authorized for detection and/or diagnosis of SARS-CoV-2 by FDA under an Emergency Use Authorization (EUA). This EUA will remain in effect (meaning this test can be used) for the duration of the COVID-19 declaration under Section 564(b)(1) of the Act, 21 U.S.C. section 360bbb-3(b)(1), unless the authorization is terminated or revoked.  Performed at [facility], [address] [place], [place] [address]      Time coordinating discharge: Over 30 minutes  SIGNED:   Wyvonnia Dusky, MD  [facility] 04/14/2021, 10:28 AM Pager   If 7PM-7AM, please contact night-coverage

## 2021-04-14 NOTE — Consult Note (Signed)
[facility] for Electrolyte Monitoring and Replacement   Recent Labs: Potassium (mmol/L)  Date Value  04/14/2021 3.6   Magnesium (mg/dL)  Date Value  04/14/2021 2.2   Calcium (mg/dL)  Date Value  04/14/2021 8.7 (L)   Albumin (g/dL)  Date Value  04/12/2021 3.9  01/12/2017 4.6   Phosphorus (mg/dL)  Date Value  04/13/2021 4.1   Sodium (mmol/L)  Date Value  04/14/2021 134 (L)  01/12/2017 143   Assessment: Patient is a 56 y/o M with medical history including hypertension, medication non-compliance, OSA who is admitted with hypertensive emergency complicated by intraparenchymal hemorrhage. Pharmacy consulted to assist with electrolyte monitoring and replacement as indicated.  Goal of Therapy:  Electrolytes within normal limits  Plan:  -on hydrochlorothiazide BID -no replacement at this time --Follow-up electrolytes with AM labs   Ian Moore A 04/14/2021 11:39 AM

## 2021-04-14 NOTE — Plan of Care (Signed)

## 2021-04-14 NOTE — Progress Notes (Signed)
AVS explained to patient and family. Letters and paper prescriptions given.

## 2021-04-15 ENCOUNTER — Telehealth: Payer: Self-pay

## 2021-04-15 DIAGNOSIS — I619 Nontraumatic intracerebral hemorrhage, unspecified: Secondary | ICD-10-CM | POA: Diagnosis not present

## 2021-04-15 DIAGNOSIS — I10 Essential (primary) hypertension: Secondary | ICD-10-CM | POA: Diagnosis not present

## 2021-04-15 DIAGNOSIS — R200 Anesthesia of skin: Secondary | ICD-10-CM | POA: Diagnosis not present

## 2021-04-15 LAB — GLUCOSE, CAPILLARY: Glucose-Capillary: 121 mg/dL — ABNORMAL HIGH (ref 70–99)

## 2021-04-15 NOTE — Telephone Encounter (Signed)
LVM to return my call °

## 2021-04-17 ENCOUNTER — Inpatient Hospital Stay: Payer: Commercial Managed Care - PPO | Admitting: Family Medicine

## 2021-04-24 ENCOUNTER — Encounter: Payer: Self-pay | Admitting: Family Medicine

## 2021-04-24 ENCOUNTER — Ambulatory Visit: Payer: BLUE CROSS/BLUE SHIELD | Admitting: Family Medicine

## 2021-04-24 ENCOUNTER — Other Ambulatory Visit: Payer: Self-pay

## 2021-04-24 VITALS — BP 138/83 | HR 62 | Ht 65.0 in | Wt 195.8 lb

## 2021-04-24 DIAGNOSIS — I10 Essential (primary) hypertension: Secondary | ICD-10-CM

## 2021-04-24 DIAGNOSIS — I6930 Unspecified sequelae of cerebral infarction: Secondary | ICD-10-CM | POA: Diagnosis not present

## 2021-04-24 MED ORDER — HYDROCHLOROTHIAZIDE 25 MG PO TABS: 25.0000 mg | ORAL_TABLET | Freq: Every day | ORAL | 3 refills | Status: DC

## 2021-04-24 MED ORDER — ATORVASTATIN CALCIUM 80 MG PO TABS: 80.0000 mg | ORAL_TABLET | Freq: Every day | ORAL | 3 refills | Status: DC

## 2021-04-24 MED ORDER — AMLODIPINE BESYLATE 10 MG PO TABS: 10.0000 mg | ORAL_TABLET | Freq: Every day | ORAL | 3 refills | Status: DC

## 2021-04-24 NOTE — Progress Notes (Signed)
Subjective:    Patient ID: Ian Moore, male    DOB: 1966-02-08, 56 y.o.   MRN: [id number]  Ian Moore is a 56 y.o. male presenting on 04/24/2021 for Hospitalization Follow-up  Here with daughter.  Previous PCP Cyndia Skeeters, FNP, last visit here 03/2020  HPI  HOSPITAL FOLLOW-UP VISIT  Hospital/Location: [facility] Date of Admission: 04/11/21 Date of Discharge: 04/14/21 Transitions of care telephone call: Completed on 04/15/21  Reason for Admission: Hemorrhagic Thalamic Stroke  - Hospital H&P and Discharge Summary have been reviewed - Patient presents today 10 days recent hospitalization. Brief summary of recent course, patient had symptoms of acute left sided weakness and numbness with stroke symptoms, hospitalized, identified hemorrhage on CT and hospitalized, treated in ICU, and given rx medications back on Norvasc and HCTZ and weaned Nicardipine infusion, he had run out of BP medications prior to the event.  He was discharged to home and determined not needed [facility] PT OT  His left sided weakness had improved on discharge. Admits some residual numbness on Left side abdomen and leg. But overall strength improved.  Has seen Dr Melrose Nakayama [facility] on 04/15/21 already. Continued medications, added baby aspirin 81mg  daily and advised to follow up on BP. Given note for work for 3 weeks.  - Today reports overall has done well after discharge. Symptoms of headache and weakness have resolved. He has residual numbness on L side.  Home BP log AM before medication avg 160-170 down to 135-149 / 90s to 80-87, then AM after medication avg 131-144 / 81-88, and PM readings 135-143 / 83-88.  Diet has improved low sodium.  - Changes to current meds on discharge: resumed medications  Continues on current med list below:  I have reviewed the discharge medication list, and have reconciled the current and discharge medications today.   Current Outpatient Medications:    aspirin EC 81 MG EC tablet,  Take 1 tablet (81 mg total) by mouth daily. Swallow whole., Disp: 30 tablet, Rfl: 0   amLODipine (NORVASC) 10 MG tablet, Take 1 tablet (10 mg total) by mouth daily., Disp: 90 tablet, Rfl: 3   atorvastatin (LIPITOR) 80 MG tablet, Take 1 tablet (80 mg total) by mouth daily., Disp: 90 tablet, Rfl: 3   hydrochlorothiazide (HYDRODIURIL) 25 MG tablet, Take 1 tablet (25 mg total) by mouth daily., Disp: 90 tablet, Rfl: 3  ------------------------------------------------------------------------- Social History   Tobacco Use   Smoking status: Former   Smokeless tobacco: Never  Scientific laboratory technician Use: Never used  Substance Use Topics   Alcohol use: Never   Drug use: No    Review of Systems Per HPI unless specifically indicated above     Objective:    BP 138/83    Pulse 62    Ht 5\' 5"  (1.651 m)    Wt 195 lb 12.8 oz (88.8 kg)    SpO2 100%    BMI 32.58 kg/m   Wt Readings from Last 3 Encounters:  04/24/21 195 lb 12.8 oz (88.8 kg)  04/13/21 191 lb 5.8 oz (86.8 kg)  03/22/20 202 lb (91.6 kg)    Physical Exam Vitals and nursing note reviewed.  Constitutional:      General: He is not in acute distress.    Appearance: He is well-developed. He is not diaphoretic.     Comments: Well-appearing, comfortable, cooperative  HENT:     Head: Normocephalic and atraumatic.  Eyes:     General:  Right eye: No discharge.        Left eye: No discharge.     Conjunctiva/sclera: Conjunctivae normal.  Neck:     Thyroid: No thyromegaly.  Cardiovascular:     Rate and Rhythm: Normal rate and regular rhythm.     Pulses: Normal pulses.     Heart sounds: Normal heart sounds. No murmur heard. Pulmonary:     Effort: Pulmonary effort is normal. No respiratory distress.     Breath sounds: Normal breath sounds. No wheezing or rales.  Musculoskeletal:        General: Normal range of motion.     Cervical back: Normal range of motion and neck supple.  Lymphadenopathy:     Cervical: No cervical adenopathy.   Skin:    General: Skin is warm and dry.     Findings: No erythema or rash.  Neurological:     Mental Status: He is alert and oriented to person, place, and time. Mental status is at baseline.     Comments: Distal extremity bilateral strength intact, including Left sided upper and lower extremity  Psychiatric:        Behavior: Behavior normal.     Comments: Well groomed, good eye contact, normal speech and thoughts      Results for orders placed or performed during the hospital encounter of 04/11/21  Resp Panel by RT-PCR (Flu A&B, Covid) Nasopharyngeal Swab   Specimen: Nasopharyngeal Swab; Nasopharyngeal(NP) swabs in vial transport medium  Result Value Ref Range   SARS Coronavirus 2 by RT PCR NEGATIVE NEGATIVE   Influenza A by PCR NEGATIVE NEGATIVE   Influenza B by PCR NEGATIVE NEGATIVE  Protime-INR  Result Value Ref Range   Prothrombin Time 14.2 11.4 - 15.2 seconds   INR 1.1 0.8 - 1.2  APTT  Result Value Ref Range   aPTT 33 24 - 36 seconds  CBC  Result Value Ref Range   WBC 8.0 4.0 - 10.5 K/uL   RBC 5.04 4.22 - 5.81 MIL/uL   Hemoglobin 14.7 13.0 - 17.0 g/dL   HCT 42.1 39.0 - 52.0 %   MCV 83.5 80.0 - 100.0 fL   MCH 29.2 26.0 - 34.0 pg   MCHC 34.9 30.0 - 36.0 g/dL   RDW 12.5 11.5 - 15.5 %   Platelets 184 150 - 400 K/uL   nRBC 0.0 0.0 - 0.2 %  Differential  Result Value Ref Range   Neutrophils Relative % 68 %   Neutro Abs 5.5 1.7 - 7.7 K/uL   Lymphocytes Relative 24 %   Lymphs Abs 1.9 0.7 - 4.0 K/uL   Monocytes Relative 6 %   Monocytes Absolute 0.5 0.1 - 1.0 K/uL   Eosinophils Relative 1 %   Eosinophils Absolute 0.1 0.0 - 0.5 K/uL   Basophils Relative 1 %   Basophils Absolute 0.1 0.0 - 0.1 K/uL   Immature Granulocytes 0 %   Abs Immature Granulocytes 0.03 0.00 - 0.07 K/uL  Comprehensive metabolic panel  Result Value Ref Range   Sodium 136 135 - 145 mmol/L   Potassium 2.7 (LL) 3.5 - 5.1 mmol/L   Chloride 102 98 - 111 mmol/L   CO2 23 22 - 32 mmol/L   Glucose,  Bld 155 (H) 70 - 99 mg/dL   BUN 17 6 - 20 mg/dL   Creatinine, Ser 0.95 0.61 - 1.24 mg/dL   Calcium 8.9 8.9 - 10.3 mg/dL   Total Protein 8.0 6.5 - 8.1 g/dL   Albumin 3.9 3.5 -  5.0 g/dL   AST 33 15 - 41 U/L   ALT 37 0 - 44 U/L   Alkaline Phosphatase 49 38 - 126 U/L   Total Bilirubin 0.9 0.3 - 1.2 mg/dL   GFR, Estimated >60 >60 mL/min   Anion gap 11 5 - 15  Lipid panel  Result Value Ref Range   Cholesterol 181 0 - 200 mg/dL   Triglycerides 142 <150 mg/dL   HDL 48 >40 mg/dL   Total CHOL/HDL Ratio 3.8 RATIO   VLDL 28 0 - 40 mg/dL   LDL Cholesterol 105 (H) 0 - 99 mg/dL  Hemoglobin A1c  Result Value Ref Range   Hgb A1c MFr Bld 5.9 (H) 4.8 - 5.6 %   Mean Plasma Glucose 122.63 mg/dL  Renal function panel  Result Value Ref Range   Sodium 137 135 - 145 mmol/L   Potassium 3.0 (L) 3.5 - 5.1 mmol/L   Chloride 104 98 - 111 mmol/L   CO2 25 22 - 32 mmol/L   Glucose, Bld 120 (H) 70 - 99 mg/dL   BUN 11 6 - 20 mg/dL   Creatinine, Ser 0.66 0.61 - 1.24 mg/dL   Calcium 8.3 (L) 8.9 - 10.3 mg/dL   Phosphorus 2.1 (L) 2.5 - 4.6 mg/dL   Albumin 3.9 3.5 - 5.0 g/dL   GFR, Estimated >60 >60 mL/min   Anion gap 8 5 - 15  Magnesium  Result Value Ref Range   Magnesium 2.2 1.7 - 2.4 mg/dL  TSH  Result Value Ref Range   TSH 1.662 0.350 - 4.500 uIU/mL  HIV Antibody (routine testing w rflx)  Result Value Ref Range   HIV Screen 4th Generation wRfx Non Reactive Non Reactive  CBC  Result Value Ref Range   WBC 9.6 4.0 - 10.5 K/uL   RBC 5.20 4.22 - 5.81 MIL/uL   Hemoglobin 15.1 13.0 - 17.0 g/dL   HCT 42.9 39.0 - 52.0 %   MCV 82.5 80.0 - 100.0 fL   MCH 29.0 26.0 - 34.0 pg   MCHC 35.2 30.0 - 36.0 g/dL   RDW 12.6 11.5 - 15.5 %   Platelets 207 150 - 400 K/uL   nRBC 0.0 0.0 - 0.2 %  Basic metabolic panel  Result Value Ref Range   Sodium 135 135 - 145 mmol/L   Potassium 3.4 (L) 3.5 - 5.1 mmol/L   Chloride 103 98 - 111 mmol/L   CO2 26 22 - 32 mmol/L   Glucose, Bld 120 (H) 70 - 99 mg/dL   BUN 11 6 - 20  mg/dL   Creatinine, Ser 0.71 0.61 - 1.24 mg/dL   Calcium 8.5 (L) 8.9 - 10.3 mg/dL   GFR, Estimated >60 >60 mL/min   Anion gap 6 5 - 15  CBC  Result Value Ref Range   WBC 8.5 4.0 - 10.5 K/uL   RBC 5.11 4.22 - 5.81 MIL/uL   Hemoglobin 14.9 13.0 - 17.0 g/dL   HCT 42.4 39.0 - 52.0 %   MCV 83.0 80.0 - 100.0 fL   MCH 29.2 26.0 - 34.0 pg   MCHC 35.1 30.0 - 36.0 g/dL   RDW 12.5 11.5 - 15.5 %   Platelets 214 150 - 400 K/uL   nRBC 0.0 0.0 - 0.2 %  Basic metabolic panel  Result Value Ref Range   Sodium 134 (L) 135 - 145 mmol/L   Potassium 2.9 (L) 3.5 - 5.1 mmol/L   Chloride 101 98 - 111 mmol/L   CO2  25 22 - 32 mmol/L   Glucose, Bld 139 (H) 70 - 99 mg/dL   BUN 16 6 - 20 mg/dL   Creatinine, Ser 0.82 0.61 - 1.24 mg/dL   Calcium 8.9 8.9 - 10.3 mg/dL   GFR, Estimated >60 >60 mL/min   Anion gap 8 5 - 15  Phosphorus  Result Value Ref Range   Phosphorus 4.1 2.5 - 4.6 mg/dL  Magnesium  Result Value Ref Range   Magnesium 2.3 1.7 - 2.4 mg/dL  Potassium  Result Value Ref Range   Potassium 3.6 3.5 - 5.1 mmol/L  Basic metabolic panel  Result Value Ref Range   Sodium 134 (L) 135 - 145 mmol/L   Potassium 3.6 3.5 - 5.1 mmol/L   Chloride 103 98 - 111 mmol/L   CO2 24 22 - 32 mmol/L   Glucose, Bld 127 (H) 70 - 99 mg/dL   BUN 24 (H) 6 - 20 mg/dL   Creatinine, Ser 1.20 0.61 - 1.24 mg/dL   Calcium 8.7 (L) 8.9 - 10.3 mg/dL   GFR, Estimated >60 >60 mL/min   Anion gap 7 5 - 15  Magnesium  Result Value Ref Range   Magnesium 2.2 1.7 - 2.4 mg/dL  Glucose, capillary  Result Value Ref Range   Glucose-Capillary 121 (H) 70 - 99 mg/dL  ECHOCARDIOGRAM COMPLETE  Result Value Ref Range   Weight 3,120 oz   Height 65 in   BP 119/91 mmHg   Ao pk vel 1.67 m/s   AV Area VTI 2.58 cm2   AR max vel 2.61 cm2   AV Mean grad 7.0 mmHg   AV Peak grad 11.2 mmHg   S' Lateral 3.82 cm   AV Area mean vel 2.55 cm2   Area-P 1/2 2.86 cm2   MV VTI 2.74 cm2      Assessment & Plan:   Problem List Items Addressed  This Visit     Essential hypertension   Relevant Medications   amLODipine (NORVASC) 10 MG tablet   atorvastatin (LIPITOR) 80 MG tablet   hydrochlorothiazide (HYDRODIURIL) 25 MG tablet   Other Visit Diagnoses     History of hemorrhagic cerebrovascular accident (CVA) with residual deficit    -  Primary   Relevant Medications   atorvastatin (LIPITOR) 80 MG tablet       Hx Hemorrhagic CVA w/ residual deficit HTN Improving L sided numbness, resolving weakness, not requiring HH PT OT Followed by [facility] Neuro, has already had apt, and has upcoming follow-up Refill HTN meds Amlodipine, HCTZ and Statin therapy Goal to avoid running out of meds Keep on ASA 81 BP monitoring parameters, improving home checks Follow-up as scheduled.    Meds ordered this encounter  Medications   amLODipine (NORVASC) 10 MG tablet    Sig: Take 1 tablet (10 mg total) by mouth daily.    Dispense:  90 tablet    Refill:  3   atorvastatin (LIPITOR) 80 MG tablet    Sig: Take 1 tablet (80 mg total) by mouth daily.    Dispense:  90 tablet    Refill:  3   hydrochlorothiazide (HYDRODIURIL) 25 MG tablet    Sig: Take 1 tablet (25 mg total) by mouth daily.    Dispense:  90 tablet    Refill:  3    Follow up plan: Return in about 6 weeks (around 06/05/2021) for 6 weeks Annual Physical AM apt fasting lab AFTER.   Nobie Putnam, [facility] 04/24/2021, 8:34 AM

## 2021-04-24 NOTE — Patient Instructions (Addendum)
Thank you for coming to the office today.  Keep up the great work. BP readings look good. Keep track of them for now still. Can check it once or twice a day, if elevated >150/90 consistently can increase checking again, and let me know.  Refills ordered for all 3 rx medications 90 day supply with +3 additional refills  DUE for FASTING BLOOD WORK (no food or drink after midnight before the lab appointment, only water or coffee without cream/sugar on the morning of)  SCHEDULE "Lab Only" visit in the morning at the clinic for lab draw in 6 weeks  - Make sure Lab Only appointment is at about 1 week before your next appointment, so that results will be available  For Lab Results, once available within 2-3 days of blood draw, you can can log in to MyChart online to view your results and a brief explanation. Also, we can discuss results at next follow-up visit.   Please schedule a Follow-up Appointment to: Return in about 6 weeks (around 06/05/2021) for 6 weeks Annual Physical AM apt fasting lab AFTER.  If you have any other questions or concerns, please feel free to call the office or send a message through [place]. You may also schedule an earlier appointment if necessary.  Additionally, you may be receiving a survey about your experience at our office within a few days to 1 week by e-mail or mail. We value your feedback.  Nobie Putnam, DO [place]

## 2021-05-13 ENCOUNTER — Other Ambulatory Visit: Payer: Self-pay

## 2021-05-13 ENCOUNTER — Ambulatory Visit: Payer: Commercial Managed Care - PPO | Attending: Neurology | Admitting: Physical Therapy

## 2021-05-13 ENCOUNTER — Encounter: Payer: Self-pay | Admitting: Physical Therapy

## 2021-05-13 DIAGNOSIS — I639 Cerebral infarction, unspecified: Secondary | ICD-10-CM

## 2021-05-13 DIAGNOSIS — M6281 Muscle weakness (generalized): Secondary | ICD-10-CM | POA: Diagnosis present

## 2021-05-13 NOTE — Therapy (Signed)
[facility] [address]. [place], [place], [address] Phone: [phone]   Fax:  [phone]  Physical Therapy Evaluation  Patient Details  Name: Ian Moore MRN: [id number] Date of Birth: 1966/01/18 Referring Provider (PT): Dr. Melrose Nakayama   Encounter Date: 05/13/2021   PT End of Session - 05/13/21 1008     Visit Number 1    Number of Visits 1    Date for PT Re-Evaluation 05/14/21    PT Start Time 0754    PT Stop Time 0906    PT Time Calculation (min) 72 min    Activity Tolerance Patient tolerated treatment well    Behavior During Therapy [facility] for tasks assessed/performed             Past Medical History:  Diagnosis Date   Hypertension     History reviewed. No pertinent surgical history.  There were no vitals filed for this visit.    Subjective Assessment - 05/13/21 1008     Subjective See FCE report    Patient Stated Goals Return to work    Currently in Pain? No/denies                [facility] PT Assessment - 05/13/21 0001       Assessment   Medical Diagnosis Stroke (cerebrum)    Referring Provider (PT) Dr. Melrose Nakayama    Onset Date/Surgical Date 04/11/21    Prior Therapy No      Balance Screen   Has the patient fallen in the past 6 months No      Prior Function   Level of Independence Independent      Cognition   Overall Cognitive Status Within Functional Limits for tasks assessed                 Plan - 05/13/21 1016     Clinical Impression Statement Overall Level of Work: Falls within the Heavy range.  Exerting 50 to 100 pounds of force occasionally, and/or 25 to 50 pounds of force frequently, and/or 10 to 20 pounds of force constantly to move objects.  Physical Demand requirements are in excess of those for Medium Work.    Please see the Task Performance Table for specific abilities.  Tolerance for the 8-Hour Day: Based on the individual task scores in Dynamic Strength, Position Tolerance and  Mobility, the client is able to tolerate the Heavy level of work for the 8-hour day/40-hour week.    Stability/Clinical Decision Making Evolving/Moderate complexity    Clinical Decision Making Moderate    Rehab Potential Excellent    PT Frequency One time visit    PT Treatment/Interventions ADLs/Self Care Home Management;Gait training;Therapeutic activities;Functional mobility training;Stair training;Therapeutic exercise;Balance training;Neuromuscular re-education;Patient/family education    PT Next Visit Plan FCE only.   Report faxed to Dr. Melrose Nakayama office.             Patient will benefit from skilled therapeutic intervention in order to improve the following deficits and impairments:  Decreased strength  Visit Diagnosis: Cerebrovascular accident (CVA), unspecified mechanism ([place])  Muscle weakness (generalized)     Problem List Patient Active Problem List   Diagnosis Date Noted   Thalamic hemorrhage ([place]) 04/11/2021   Suspected sleep apnea 03/22/2020   Snoring 03/22/2020   Sleep apnea 03/22/2020   Essential hypertension 03/22/2020   History of influenza vaccine allergy 03/22/2020   Encounter to establish care with new doctor 03/22/2020   Pura Spice, PT, DPT # [phone] 05/13/2021, 10:19  AM  [facility] [address] [place], [place], [address] Phone: [phone]   Fax:  [phone]  Name: Ian Moore MRN: [id number] Date of Birth: 1965-10-12

## 2021-05-17 NOTE — Addendum Note (Signed)
Addended by: Olin Hauser on: 05/17/2021 01:20 PM   Modules accepted: Level of Service

## 2021-06-06 ENCOUNTER — Ambulatory Visit (INDEPENDENT_AMBULATORY_CARE_PROVIDER_SITE_OTHER): Payer: Commercial Managed Care - PPO | Admitting: Family Medicine

## 2021-06-06 ENCOUNTER — Other Ambulatory Visit: Payer: Self-pay

## 2021-06-06 VITALS — BP 128/78 | HR 54 | Ht 65.0 in | Wt 187.2 lb

## 2021-06-06 DIAGNOSIS — Z0000 Encounter for general adult medical examination without abnormal findings: Secondary | ICD-10-CM

## 2021-06-06 DIAGNOSIS — R7309 Other abnormal glucose: Secondary | ICD-10-CM

## 2021-06-06 DIAGNOSIS — Z1211 Encounter for screening for malignant neoplasm of colon: Secondary | ICD-10-CM

## 2021-06-06 DIAGNOSIS — E66811 Obesity, class 1: Secondary | ICD-10-CM

## 2021-06-06 DIAGNOSIS — I6930 Unspecified sequelae of cerebral infarction: Secondary | ICD-10-CM | POA: Diagnosis not present

## 2021-06-06 DIAGNOSIS — I10 Essential (primary) hypertension: Secondary | ICD-10-CM | POA: Diagnosis not present

## 2021-06-06 DIAGNOSIS — E7800 Pure hypercholesterolemia, unspecified: Secondary | ICD-10-CM | POA: Insufficient documentation

## 2021-06-06 DIAGNOSIS — E669 Obesity, unspecified: Secondary | ICD-10-CM | POA: Diagnosis not present

## 2021-06-06 DIAGNOSIS — R29818 Other symptoms and signs involving the nervous system: Secondary | ICD-10-CM

## 2021-06-06 MED ORDER — PEG 3350-KCL-NA BICARB-NACL 420 G PO SOLR: 4000.0000 mL | Freq: Once | ORAL | 0 refills | Status: AC

## 2021-06-06 NOTE — Progress Notes (Signed)
Gastroenterology Pre-Procedure Review ? ?Request Date: 06/27/2021 ?Requesting Physician: Dr. Vicente Males ? ?PATIENT REVIEW QUESTIONS: The patient responded to the following health history questions as indicated:  * pt daughter answered questions on his behalf. ? ?1. Are you having any GI issues? no ?2. Do you have a personal history of Polyps? no ?3. Do you have a family history of Colon Cancer or Polyps? no ?4. Diabetes Mellitus? no ?5. Joint replacements in the past 12 months?no ?6. Major health problems in the past 3 months?no ?7. Any artificial heart valves, MVP, or defibrillator?no ?   ?MEDICATIONS & ALLERGIES:    ?Patient reports the following regarding taking any anticoagulation/antiplatelet therapy:   ?Plavix, Coumadin, Eliquis, Xarelto, Lovenox, Pradaxa, Brilinta, or Effient? no ?Aspirin? yes (81 mg) ? ?Patient confirms/reports the following medications:  ?Current Outpatient Medications  ?Medication Sig Dispense Refill  ? amLODipine (NORVASC) 10 MG tablet Take 1 tablet (10 mg total) by mouth daily. 90 tablet 3  ? aspirin EC 81 MG tablet Take 81 mg by mouth daily. Swallow whole.    ? atorvastatin (LIPITOR) 80 MG tablet Take 1 tablet (80 mg total) by mouth daily. 90 tablet 3  ? hydrochlorothiazide (HYDRODIURIL) 25 MG tablet Take 1 tablet (25 mg total) by mouth daily. 90 tablet 3  ? ?No current facility-administered medications for this visit.  ? ? ?Patient confirms/reports the following allergies:  ?Allergies  ?Allergen Reactions  ? Influenza A (H1n1) [place] Vac Rash  ? Penicillins Rash  ?  Has patient had a PCN reaction causing immediate rash, facial/tongue/throat swelling, SOB or lightheadedness with hypotension: No ?Has patient had a PCN reaction causing severe rash involving mucus membranes or skin necrosis: No ?Has patient had a PCN reaction that required hospitalization: No ?Has patient had a PCN reaction occurring within the last 10 years: No ?If all of the above answers are "NO", then may proceed with  Cephalosporin use. ?  ? ? ?No orders of the defined types were placed in this encounter. ? ? ?AUTHORIZATION INFORMATION ?Primary Insurance: ?1D#: ?Group #: ? ?Secondary Insurance: ?1D#: ?Group #: ? ?SCHEDULE INFORMATION: ?Date: 06/27/2021 ?Time: ?Location: [facility] ? ?

## 2021-06-06 NOTE — Assessment & Plan Note (Signed)
Improved clinical symptoms ?Has not pursued PSG ?

## 2021-06-06 NOTE — Patient Instructions (Addendum)
Thank you for coming to the office today. ? ?[facility]) ?[place], [place] [address] ?Phone: [phone] ? ?Referral sent. STay tuned for apt. ? ?Labs today, will follow up on results ? ?Blood pressure is excellent. Keep up the good work! ? ?Please schedule a Follow-up Appointment to: Return in about 6 months (around 12/07/2021) for 6 month follow-up HTN. ? ?If you have any other questions or concerns, please feel free to call the office or send a message through [place]. You may also schedule an earlier appointment if necessary. ? ?Additionally, you may be receiving a survey about your experience at our office within a few days to 1 week by e-mail or mail. We value your feedback. ? ?Nobie Putnam, DO ?[place] ?

## 2021-06-06 NOTE — Assessment & Plan Note (Signed)
Controlled on statin therapy. ?

## 2021-06-06 NOTE — Progress Notes (Signed)
Subjective:    Patient ID: Ian Moore, male    DOB: 05/26/1965, 56 y.o.   MRN: [id number]  Ian Moore is a 56 y.o. male presenting on 06/06/2021 for Annual Exam  Here with daughter Ian Moore, provides interpretation in [place].  HPI  Here for Annual Physical and Lab Orders  CHRONIC HTN: Reports home BP checks with good results  Current Meds - Amlodipine '10mg'$  daily, HCTZ '25mg'$  daily   Reports good compliance, took meds today. Tolerating well, w/o complaints. Lifestyle: - Diet: reduced sodium intake - Exercise: increased exercise Denies CP, dyspnea, HA, edema, dizziness / lightheadedness  HYPERLIPIDEMIA: - Reports no concerns. Last lipid panel 03/2021, mild elevated LDL 105 - Currently taking Atorvastatin '80mg'$  nightly, tolerating well without side effects or myalgias  History of CVA Followed by [facility] Neurology Dr Melrose Nakayama, he has been released to return to work on 06/03/21 however he has not returned yet and HR recommended next week since he had doctor's appointments this week. Will need a return to work note.  Snoring/ Suspected Sleep Apnea in past. He had observed apnea and snoring, and since hospitalization and optimized medicines and improved lifestyle, he has had resolved.  Health Maintenance:  Prostate CA Screening: No prior prostate CA screening. Currently asymptomatic . No known family history of prostate CA. Due for screening.  Colon CA Screening: Never had colonoscopy or prior testing. Currently asymptomatic. No known family history of colon CA. Due for screening test new referral to GI for colonoscopy   E78  R73.09 I10 I69.30  Depression screen [facility] 2/9 06/06/2021 04/24/2021 12/25/2016  Decreased Interest 0 0 0  Down, Depressed, Hopeless 0 0 0  PHQ - 2 Score 0 0 0  Altered sleeping 0 0 0  Tired, decreased energy 0 0 0  Change in appetite 0 0 0  Feeling bad or failure about yourself  0 0 0  Trouble concentrating 0 0 0  Moving slowly or fidgety/restless 0 0 0   Suicidal thoughts 0 0 0  PHQ-9 Score 0 0 0  Difficult doing work/chores Not difficult at all Not difficult at all -    Past Medical History:  Diagnosis Date   Hypertension    No past surgical history on file. Social History   Socioeconomic History   Marital status: Married    Spouse name: Not on file   Number of children: Not on file   Years of education: Not on file   Highest education level: Not on file  Occupational History   Not on file  Tobacco Use   Smoking status: Former   Smokeless tobacco: Never  Vaping Use   Vaping Use: Never used  Substance and Sexual Activity   Alcohol use: Never   Drug use: No   Sexual activity: Not on file  Other Topics Concern   Not on file  Social History Narrative   Not on file   Social Determinants of Health   Financial Resource Strain: Not on file  Food Insecurity: Not on file  Transportation Needs: Not on file  Physical Activity: Not on file  Stress: Not on file  Social Connections: Not on file  Intimate Partner Violence: Not on file   No family history on file. Current Outpatient Medications on File Prior to Visit  Medication Sig   amLODipine (NORVASC) 10 MG tablet Take 1 tablet (10 mg total) by mouth daily.   aspirin EC 81 MG tablet Take 81 mg by mouth daily. Swallow whole.   atorvastatin (  LIPITOR) 80 MG tablet Take 1 tablet (80 mg total) by mouth daily.   hydrochlorothiazide (HYDRODIURIL) 25 MG tablet Take 1 tablet (25 mg total) by mouth daily.   No current facility-administered medications on file prior to visit.    Review of Systems  Constitutional:  Negative for activity change, appetite change, chills, diaphoresis, fatigue and fever.  HENT:  Negative for congestion and hearing loss.   Eyes:  Negative for visual disturbance.  Respiratory:  Negative for cough, chest tightness, shortness of breath and wheezing.   Cardiovascular:  Negative for chest pain, palpitations and leg swelling.  Gastrointestinal:  Negative  for abdominal pain, constipation, diarrhea, nausea and vomiting.  Genitourinary:  Negative for dysuria, frequency and hematuria.  Musculoskeletal:  Negative for arthralgias and neck pain.  Skin:  Negative for rash.  Neurological:  Negative for dizziness, weakness, light-headedness, numbness and headaches.  Hematological:  Negative for adenopathy.  Psychiatric/Behavioral:  Negative for behavioral problems, dysphoric mood and sleep disturbance.   All other systems reviewed and are negative. Per HPI unless specifically indicated above      Objective:    BP 128/78    Pulse (!) 54    Ht '5\' 5"'$  (1.651 m)    Wt 187 lb 3.2 oz (84.9 kg)    SpO2 100%    BMI 31.15 kg/m   Wt Readings from Last 3 Encounters:  06/06/21 187 lb 3.2 oz (84.9 kg)  04/24/21 195 lb 12.8 oz (88.8 kg)  04/13/21 191 lb 5.8 oz (86.8 kg)    Physical Exam Vitals and nursing note reviewed.  Constitutional:      General: He is not in acute distress.    Appearance: He is well-developed. He is not diaphoretic.     Comments: Well-appearing, comfortable, cooperative  HENT:     Head: Normocephalic and atraumatic.  Eyes:     General:        Right eye: No discharge.        Left eye: No discharge.     Conjunctiva/sclera: Conjunctivae normal.     Pupils: Pupils are equal, round, and reactive to light.  Neck:     Thyroid: No thyromegaly.  Cardiovascular:     Rate and Rhythm: Normal rate and regular rhythm.     Pulses: Normal pulses.     Heart sounds: Normal heart sounds. No murmur heard. Pulmonary:     Effort: Pulmonary effort is normal. No respiratory distress.     Breath sounds: Normal breath sounds. No wheezing or rales.  Abdominal:     General: Bowel sounds are normal. There is no distension.     Palpations: Abdomen is soft. There is no mass.     Tenderness: There is no abdominal tenderness.  Musculoskeletal:        General: No tenderness. Normal range of motion.     Cervical back: Normal range of motion and neck  supple.     Right lower leg: No edema.     Left lower leg: No edema.     Comments: Upper / Lower Extremities: - Normal muscle tone, strength bilateral upper extremities 5/5, lower extremities 5/5  Lymphadenopathy:     Cervical: No cervical adenopathy.  Skin:    General: Skin is warm and dry.     Findings: No erythema or rash.  Neurological:     Mental Status: He is alert and oriented to person, place, and time.     Comments: Distal sensation intact to light touch all extremities  Psychiatric:  Mood and Affect: Mood normal.        Behavior: Behavior normal.        Thought Content: Thought content normal.     Comments: Well groomed, good eye contact, normal speech and thoughts   Results for orders placed or performed in visit on 06/06/21  TEST AUTHORIZATION 2  Result Value Ref Range   TEST NAME: HEMOGLOBIN A1C W/EAG    TEST CODE: 25,852    CLIENT CONTACT: DR. Parks Ranger    REPORT ALWAYS MESSAGE SIGNATURE    TEST AUTHORIZATION  Result Value Ref Range   TEST NAME: LIPID PANEL    TEST CODE: 7,600    CLIENT CONTACT: DR. Parks Ranger,    REPORT ALWAYS MESSAGE SIGNATURE        Assessment & Plan:   Problem List Items Addressed This Visit     Suspected sleep apnea    Improved clinical symptoms Has not pursued PSG      History of hemorrhagic cerebrovascular accident (CVA) with residual deficit   Essential hypertension    Well-controlled HTN Complication with Hx CVA   Plan:  1. Continue Amlodipine '10mg'$  daily, HCTZ '25mg'$  daily 2. Encourage improved lifestyle - low sodium diet, regular exercise 3. Continue monitor BP outside office, bring readings to next visit, if persistently >140/90 or new symptoms notify office sooner      Relevant Medications   aspirin EC 81 MG tablet   Other Relevant Orders   COMPLETE METABOLIC PANEL WITH GFR   CBC with Differential/Platelet   Elevated LDL cholesterol level    Controlled on statin therapy.      Other Visit Diagnoses      Annual physical exam    -  Primary   Relevant Orders   COMPLETE METABOLIC PANEL WITH GFR   CBC with Differential/Platelet   Lipid panel   Hemoglobin A1c   PSA   Obesity (BMI 30.0-34.9)       Screening for colon cancer       Relevant Orders   Ambulatory referral to Gastroenterology   Elevated hemoglobin A1c           Updated Health Maintenance information Fasting labs today Encouraged improvement to lifestyle with diet and exercise Goal of weight loss  Referral to Colonoscopy AGI  Orders Placed This Encounter  Procedures   COMPLETE METABOLIC PANEL WITH GFR   CBC with Differential/Platelet   Lipid panel    Order Specific Question:   Has the patient fasted?    Answer:   Yes   Hemoglobin A1c   PSA   TEST AUTHORIZATION 2   TEST AUTHORIZATION   Ambulatory referral to Gastroenterology    Referral Priority:   Routine    Referral Type:   Consultation    Referral Reason:   Specialty Services Required    Number of Visits Requested:   1     No orders of the defined types were placed in this encounter.    Follow up plan: Return in about 6 months (around 12/07/2021) for 6 month follow-up HTN.  Nobie Putnam, [facility] 06/06/2021, 8:32 AM

## 2021-06-06 NOTE — Assessment & Plan Note (Signed)
Well-controlled HTN ?Complication with Hx CVA ?  ?Plan:  ?1. Continue Amlodipine '10mg'$  daily, HCTZ '25mg'$  daily ?2. Encourage improved lifestyle - low sodium diet, regular exercise ?3. Continue monitor BP outside office, bring readings to next visit, if persistently >140/90 or new symptoms notify office sooner ?

## 2021-06-07 ENCOUNTER — Other Ambulatory Visit: Payer: Self-pay | Admitting: Family Medicine

## 2021-06-07 DIAGNOSIS — E876 Hypokalemia: Secondary | ICD-10-CM

## 2021-06-07 LAB — COMPLETE METABOLIC PANEL WITH GFR
AG Ratio: 1.1 (calc) (ref 1.0–2.5)
ALT: 22 U/L (ref 9–46)
AST: 22 U/L (ref 10–35)
Albumin: 4.1 g/dL (ref 3.6–5.1)
Alkaline phosphatase (APISO): 55 U/L (ref 35–144)
BUN: 18 mg/dL (ref 7–25)
CO2: 31 mmol/L (ref 20–32)
Calcium: 9.2 mg/dL (ref 8.6–10.3)
Chloride: 97 mmol/L — ABNORMAL LOW (ref 98–110)
Creat: 0.94 mg/dL (ref 0.70–1.30)
Globulin: 3.8 g/dL (calc) — ABNORMAL HIGH (ref 1.9–3.7)
Glucose, Bld: 96 mg/dL (ref 65–99)
Potassium: 3.3 mmol/L — ABNORMAL LOW (ref 3.5–5.3)
Sodium: 137 mmol/L (ref 135–146)
Total Bilirubin: 0.8 mg/dL (ref 0.2–1.2)
Total Protein: 7.9 g/dL (ref 6.1–8.1)
eGFR: 95 mL/min/{1.73_m2} (ref >=60)

## 2021-06-07 LAB — CBC WITH DIFFERENTIAL/PLATELET
Absolute Monocytes: 507 cells/uL (ref 200–950)
Basophils Absolute: 43 cells/uL (ref 0–200)
Basophils Relative: 0.5 %
Eosinophils Absolute: 103 cells/uL (ref 15–500)
Eosinophils Relative: 1.2 %
HCT: 39.2 % (ref 38.5–50.0)
Hemoglobin: 13.1 g/dL — ABNORMAL LOW (ref 13.2–17.1)
Lymphs Abs: 1969 cells/uL (ref 850–3900)
MCH: 28.7 pg (ref 27.0–33.0)
MCHC: 33.4 g/dL (ref 32.0–36.0)
MCV: 85.8 fL (ref 80.0–100.0)
MPV: 12.3 fL (ref 7.5–12.5)
Monocytes Relative: 5.9 %
Neutro Abs: 5977 cells/uL (ref 1500–7800)
Neutrophils Relative %: 69.5 %
Platelets: 199 10*3/uL (ref 140–400)
RBC: 4.57 10*6/uL (ref 4.20–5.80)
RDW: 12.7 % (ref 11.0–15.0)
Total Lymphocyte: 22.9 %
WBC: 8.6 10*3/uL (ref 3.8–10.8)

## 2021-06-07 LAB — TEST AUTHORIZATION 2: TEST CODE:: 16802

## 2021-06-07 LAB — TEST AUTHORIZATION: TEST CODE:: 7600

## 2021-06-07 LAB — LIPID PANEL
Cholesterol: 96 mg/dL (ref <200)
HDL: 43 mg/dL (ref >=40)
LDL Cholesterol (Calc): 38 mg/dL (calc)
Non-HDL Cholesterol (Calc): 53 mg/dL (calc) (ref <130)
Total CHOL/HDL Ratio: 2.2 (calc) (ref <5.0)
Triglycerides: 69 mg/dL (ref <150)

## 2021-06-07 LAB — HEMOGLOBIN A1C
Hgb A1c MFr Bld: 6 % of total Hgb — ABNORMAL HIGH (ref <5.7)
Mean Plasma Glucose: 126 mg/dL
eAG (mmol/L): 7 mmol/L

## 2021-06-07 LAB — PSA: PSA: 1.44 ng/mL (ref <=4.00)

## 2021-06-07 MED ORDER — POTASSIUM CHLORIDE CRYS ER 20 MEQ PO TBCR: 20.0000 meq | EXTENDED_RELEASE_TABLET | Freq: Every day | ORAL | 3 refills | Status: DC

## 2021-06-26 ENCOUNTER — Encounter: Payer: Self-pay | Admitting: Gastroenterology

## 2021-06-27 ENCOUNTER — Ambulatory Visit: Payer: Commercial Managed Care - PPO | Admitting: Anesthesiology

## 2021-06-27 ENCOUNTER — Ambulatory Visit
Admission: RE | Admit: 2021-06-27 | Discharge: 2021-06-27 | Disposition: A | Discharge status: Home / Self Care | Payer: Commercial Managed Care - PPO | Attending: Gastroenterology | Admitting: Gastroenterology

## 2021-06-27 ENCOUNTER — Encounter: Payer: Self-pay | Admitting: Gastroenterology

## 2021-06-27 ENCOUNTER — Encounter
Admission: RE | Disposition: A | Discharge status: Home / Self Care | Payer: Self-pay | Source: Home / Self Care | Attending: Gastroenterology

## 2021-06-27 DIAGNOSIS — K640 First degree hemorrhoids: Secondary | ICD-10-CM | POA: Insufficient documentation

## 2021-06-27 DIAGNOSIS — Z1211 Encounter for screening for malignant neoplasm of colon: Secondary | ICD-10-CM | POA: Diagnosis not present

## 2021-06-27 DIAGNOSIS — D124 Benign neoplasm of descending colon: Secondary | ICD-10-CM | POA: Insufficient documentation

## 2021-06-27 DIAGNOSIS — Z8673 Personal history of transient ischemic attack (TIA), and cerebral infarction without residual deficits: Secondary | ICD-10-CM | POA: Insufficient documentation

## 2021-06-27 DIAGNOSIS — Z87891 Personal history of nicotine dependence: Secondary | ICD-10-CM | POA: Insufficient documentation

## 2021-06-27 DIAGNOSIS — D122 Benign neoplasm of ascending colon: Secondary | ICD-10-CM | POA: Insufficient documentation

## 2021-06-27 DIAGNOSIS — I10 Essential (primary) hypertension: Secondary | ICD-10-CM | POA: Diagnosis not present

## 2021-06-27 DIAGNOSIS — K635 Polyp of colon: Secondary | ICD-10-CM

## 2021-06-27 DIAGNOSIS — G4730 Sleep apnea, unspecified: Secondary | ICD-10-CM | POA: Insufficient documentation

## 2021-06-27 DIAGNOSIS — K649 Unspecified hemorrhoids: Secondary | ICD-10-CM | POA: Diagnosis not present

## 2021-06-27 HISTORY — PX: COLONOSCOPY WITH PROPOFOL: SHX5780

## 2021-06-27 HISTORY — DX: Cerebral infarction, unspecified: I63.9

## 2021-06-27 SURGERY — COLONOSCOPY WITH PROPOFOL
Anesthesia: General

## 2021-06-27 MED ORDER — SODIUM CHLORIDE 0.9 % IV SOLN: INTRAVENOUS | Status: DC

## 2021-06-27 MED ORDER — PROPOFOL 10 MG/ML IV BOLUS
INTRAVENOUS | Status: DC | PRN
  Administered 2021-06-27: 120 ug/kg/min via INTRAVENOUS
  Administered 2021-06-27: 100 mg via INTRAVENOUS

## 2021-06-27 MED ORDER — LIDOCAINE HCL (CARDIAC) PF 100 MG/5ML IV SOSY
PREFILLED_SYRINGE | INTRAVENOUS | Status: DC | PRN
  Administered 2021-06-27: 100 mg via INTRAVENOUS

## 2021-06-27 MED ORDER — PROPOFOL 10 MG/ML IV BOLUS
INTRAVENOUS | Status: AC
  Filled 2021-06-27: qty 20

## 2021-06-27 MED ORDER — LIDOCAINE HCL (PF) 2 % IJ SOLN
INTRAMUSCULAR | Status: AC
  Filled 2021-06-27: qty 5

## 2021-06-27 NOTE — Anesthesia Preprocedure Evaluation (Addendum)
Anesthesia Evaluation  ?Patient identified by MRN, date of birth, ID band ?Patient awake ? ? ? ?Reviewed: ?Allergy & Precautions, NPO status , Patient's Chart, lab work & pertinent test results ? ?Airway ?Mallampati: II ? ?TM Distance: >3 FB ?Neck ROM: Full ? ? ? Dental ? ?(+) Teeth Intact ?  ?Pulmonary ?neg pulmonary ROS, sleep apnea , former smoker,  ?  ?Pulmonary exam normal ? ? ? ? ? ? ? Cardiovascular ?Exercise Tolerance: Good ?hypertension, Pt. on medications ?negative cardio ROS ?Normal cardiovascular exam ?Rhythm:Regular Rate:Normal ? ? ?  ?Neuro/Psych ?CVA, No Residual Symptoms negative neurological ROS ? negative psych ROS  ? GI/Hepatic ?negative GI ROS, Neg liver ROS,   ?Endo/Other  ?negative endocrine ROS ? Renal/GU ?negative Renal ROS  ?negative genitourinary ?  ?Musculoskeletal ?negative musculoskeletal ROS ?(+)  ? Abdominal ?Normal abdominal exam  (+)   ?Peds ?negative pediatric ROS ?(+)  Hematology ?negative hematology ROS ?(+)   ?Anesthesia Other Findings ?Past Medical History: ?No date: Hypertension ?No date: Stroke [facility]) ? ?Past Surgical History: ?No date: NO PAST SURGERIES ? ?BMI   ? Body Mass Index: 31.12 kg/m?  ?  ? ? Reproductive/Obstetrics ?negative OB ROS ? ?  ? ? ? ? ? ? ? ? ? ? ? ? ? ?  ?  ? ? ? ? ? ? ? ?Anesthesia Physical ?Anesthesia Plan ? ?ASA: 3 ? ?Anesthesia Plan: General  ? ?Post-op Pain Management:   ? ?Induction: Intravenous ? ?PONV Risk Score and Plan: Propofol infusion and TIVA ? ?Airway Management Planned: Natural Airway and Nasal Cannula ? ?Additional Equipment:  ? ?Intra-op Plan:  ? ?Post-operative Plan:  ? ?Informed Consent: I have reviewed the patients History and Physical, chart, labs and discussed the procedure including the risks, benefits and alternatives for the proposed anesthesia with the patient or authorized representative who has indicated his/her understanding and acceptance.  ? ? ? ?Dental Advisory Given ? ?Plan Discussed with:  CRNA and Surgeon ? ?Anesthesia Plan Comments:   ? ? ? ? ? ? ?Anesthesia Quick Evaluation ? ?

## 2021-06-27 NOTE — Op Note (Addendum)
[facility] ?Gastroenterology ?Patient Name: Ian Moore ?Procedure Date: 06/27/2021 9:50 AM ?MRN: [id number] ?Account #: [id number] ?Date of Birth: 08/17/1965 ?Admit Type: Outpatient ?Age: 56 ?Room: [facility] ENDO ROOM 3 ?Gender: Male ?Note Status: Finalized ?Instrument Name: Colonoscope [id number] ?Procedure:             Colonoscopy ?Indications:           Screening for colorectal malignant neoplasm ?Providers:             Jonathon Bellows MD, MD ?Referring MD:          Olin Hauser (Referring MD) ?Medicines:             Monitored Anesthesia Care ?Complications:         No immediate complications. ?Procedure:             Pre-Anesthesia Assessment: ?                       - Prior to the procedure, a History and Physical was  ?                       performed, and patient medications, allergies and  ?                       sensitivities were reviewed. The patient's tolerance  ?                       of previous anesthesia was reviewed. ?                       - The risks and benefits of the procedure and the  ?                       sedation options and risks were discussed with the  ?                       patient. All questions were answered and informed  ?                       consent was obtained. ?                       - ASA Grade Assessment: II - A patient with mild  ?                       systemic disease. ?                       After obtaining informed consent, the colonoscope was  ?                       passed under direct vision. Throughout the procedure,  ?                       the patient's blood pressure, pulse, and oxygen  ?                       saturations were monitored continuously. The  ?                       Colonoscope was introduced  through the anus and  ?                       advanced to the the cecum, identified by the  ?                       appendiceal orifice. The colonoscopy was performed  ?                       with ease. The patient tolerated the procedure  well.  ?                       The quality of the bowel preparation was excellent. ?Findings: ?     The perianal and digital rectal examinations were normal. ?     Two sessile polyps were found in the descending colon and ascending  ?     colon. The polyps were 5 to 7 mm in size. These polyps were removed with  ?     a cold snare. Resection and retrieval were complete. ?     Non-bleeding internal hemorrhoids were found during retroflexion. The  ?     hemorrhoids were large and Grade I (internal hemorrhoids that do not  ?     prolapse). ?     The exam was otherwise without abnormality on direct and retroflexion  ?     views. ?Impression:            - Two 5 to 7 mm polyps in the descending colon and in  ?                       the ascending colon, removed with a cold snare.  ?                       Resected and retrieved. ?                       - Non-bleeding internal hemorrhoids. ?                       - The examination was otherwise normal on direct and  ?                       retroflexion views. ?Recommendation:        - Discharge patient to home (with escort). ?                       - Resume previous diet. ?                       - Continue present medications. ?                       - Await pathology results. ?                       - Repeat colonoscopy for surveillance based on  ?                       pathology results. ?Procedure Code(s):     --- Professional --- ?  45385, Colonoscopy, flexible; with removal of  ?                       tumor(s), polyp(s), or other lesion(s) by snare  ?                       technique ?Diagnosis Code(s):     --- Professional --- ?                       Z12.11, Encounter for screening for malignant neoplasm  ?                       of colon ?                       K63.5, Polyp of colon ?                       K64.0, First degree hemorrhoids ?CPT copyright 2019 American Medical Association. All rights reserved. ?The codes documented in this report are  preliminary and upon coder review may  ?be revised to meet current compliance requirements. ?Jonathon Bellows, MD ?Jonathon Bellows MD, MD ?06/27/2021 10:31:03 AM ?This report has been signed electronically. ?Number of Addenda: 0 ?Note Initiated On: 06/27/2021 9:50 AM ?Scope Withdrawal Time: 0 hours 16 minutes 22 seconds  ?Total Procedure Duration: 0 hours 17 minutes 26 seconds  ?Estimated Blood Loss:  Estimated blood loss: none. ?     [facility] ?

## 2021-06-27 NOTE — Anesthesia Postprocedure Evaluation (Signed)
Anesthesia Post Note ? ?Patient: Ian Moore ? ?Procedure(s) Performed: COLONOSCOPY WITH PROPOFOL ? ?Patient location during evaluation: PACU ?Anesthesia Type: General ?Level of consciousness: awake and oriented ?Pain management: pain level controlled ?Vital Signs Assessment: post-procedure vital signs reviewed and stable ?Respiratory status: spontaneous breathing ?Cardiovascular status: stable ?Anesthetic complications: no ? ? ?No notable events documented. ? ? ?Last Vitals:  ?Vitals:  ? 06/27/21 0912 06/27/21 1033  ?BP: (!) 154/88 108/81  ?Pulse: 63   ?Resp: 17 18  ?Temp: (!) 36.3 ?C (!) 35.8 ?C  ?SpO2: 100% 100%  ?  ?Last Pain:  ?Vitals:  ? 06/27/21 1033  ?TempSrc: Temporal  ?PainSc: 0-No pain  ? ? ?  ?  ?  ?  ?  ?  ? ?VAN STAVEREN,Jakirah Zaun ? ? ? ? ?

## 2021-06-27 NOTE — Transfer of Care (Signed)
Immediate Anesthesia Transfer of Care Note ? ?Patient: Ian Moore ? ?Procedure(s) Performed: COLONOSCOPY WITH PROPOFOL ? ?Patient Location: PACU ? ?Anesthesia Type:General ? ?Level of Consciousness: awake ? ?Airway & Oxygen Therapy: Patient Spontanous Breathing ? ?Post-op Assessment: Report given to RN and Post -op Vital signs reviewed and stable ? ?Post vital signs: Reviewed and stable ? ?Last Vitals:  ?Vitals Value Taken Time  ?BP 108/81 06/27/21 1033  ?Temp 35.8 ?C 06/27/21 1033  ?Pulse 66 06/27/21 1036  ?Resp 16 06/27/21 1036  ?SpO2 100 % 06/27/21 1036  ?Vitals shown include unvalidated device data. ? ?Last Pain:  ?Vitals:  ? 06/27/21 1033  ?TempSrc: Temporal  ?PainSc:   ?   ? ?  ? ?Complications: No notable events documented. ?

## 2021-06-27 NOTE — H&P (Signed)
? ? ? ?Jonathon Bellows, MD ?[address] [place], [place], [place], [address] ?[address] [place], [place], [place], [address] ?Phone: [phone]  ?Fax: [phone] ? ?Primary Care Physician:  Olin Hauser, DO ? ? ?Pre-Procedure History & Physical: ?HPI:  Ian Moore is a 56 y.o. male is here for an colonoscopy. ?  ?Past Medical History:  ?Diagnosis Date  ? Hypertension   ? Stroke [facility])   ? ? ?Past Surgical History:  ?Procedure Laterality Date  ? NO PAST SURGERIES    ? ? ?Prior to Admission medications   ?Medication Sig Start Date End Date Taking? Authorizing Provider  ?amLODipine (NORVASC) 10 MG tablet Take 1 tablet (10 mg total) by mouth daily. 04/24/21  Yes Karamalegos, Devonne Doughty, DO  ?atorvastatin (LIPITOR) 80 MG tablet Take 1 tablet (80 mg total) by mouth daily. 04/24/21  Yes Karamalegos, Devonne Doughty, DO  ?hydrochlorothiazide (HYDRODIURIL) 25 MG tablet Take 1 tablet (25 mg total) by mouth daily. 04/24/21  Yes Karamalegos, Devonne Doughty, DO  ?aspirin EC 81 MG tablet Take 81 mg by mouth daily. Swallow whole.    [provider]  ?potassium chloride SA (KLOR-CON M) 20 MEQ tablet Take 1 tablet (20 mEq total) by mouth daily. 06/07/21   Olin Hauser, DO  ? ? ?Allergies as of 06/06/2021 - Review Complete 06/06/2021  ?Allergen Reaction Noted  ? Influenza a (h1n1) monoval vac Rash 07/22/2015  ? Penicillins Rash 12/25/2016  ? ? ?History reviewed. No pertinent family history. ? ?Social History  ? ?Socioeconomic History  ? Marital status: Married  ?  Spouse name: Not on file  ? Number of children: Not on file  ? Years of education: Not on file  ? Highest education level: Not on file  ?Occupational History  ? Not on file  ?Tobacco Use  ? Smoking status: Former  ? Smokeless tobacco: Never  ?Vaping Use  ? Vaping Use: Never used  ?Substance and Sexual Activity  ? Alcohol use: Never  ? Drug use: No  ? Sexual activity: Not on file  ?Other Topics Concern  ? Not on file  ?Social History  Narrative  ? Not on file  ? ?Social Determinants of Health  ? ?Financial Resource Strain: Not on file  ?Food Insecurity: Not on file  ?Transportation Needs: Not on file  ?Physical Activity: Not on file  ?Stress: Not on file  ?Social Connections: Not on file  ?Intimate Partner Violence: Not on file  ? ? ?Review of Systems: ?See HPI, otherwise negative ROS ? ?Physical Exam: ?BP (!) 154/88   Pulse 63   Temp (!) 97.3 ?F (36.3 ?C) (Temporal)   Resp 17   Ht '5\' 5"'$  (1.651 m)   Wt 84.8 kg   SpO2 100%   BMI 31.12 kg/m?  ?General:   Alert,  pleasant and cooperative in NAD ?Head:  Normocephalic and atraumatic. ?Neck:  Supple; no masses or thyromegaly. ?Lungs:  Clear throughout to auscultation, normal respiratory effort.    ?Heart:  +S1, +S2, Regular rate and rhythm, No edema. ?Abdomen:  Soft, nontender and nondistended. Normal bowel sounds, without guarding, and without rebound.   ?Neurologic:  Alert and  oriented x4;  grossly normal neurologically. ? ?Impression/Plan: ?Ian Moore is here for an colonoscopy to be performed for Screening colonoscopy average risk   ?Risks, benefits, limitations, and alternatives regarding  colonoscopy have been reviewed with the patient.  Questions have been answered.  All parties agreeable. ? ? ?Jonathon Bellows, MD  06/27/2021, 9:50  AM ? ?

## 2021-06-28 ENCOUNTER — Encounter: Payer: Self-pay | Admitting: Gastroenterology

## 2021-06-28 LAB — SURGICAL PATHOLOGY

## 2021-07-01 ENCOUNTER — Encounter: Payer: Self-pay | Admitting: Gastroenterology

## 2021-11-19 DIAGNOSIS — I10 Essential (primary) hypertension: Secondary | ICD-10-CM | POA: Diagnosis not present

## 2021-11-19 DIAGNOSIS — R202 Paresthesia of skin: Secondary | ICD-10-CM | POA: Diagnosis not present

## 2021-11-19 DIAGNOSIS — I619 Nontraumatic intracerebral hemorrhage, unspecified: Secondary | ICD-10-CM | POA: Diagnosis not present

## 2021-11-19 DIAGNOSIS — R200 Anesthesia of skin: Secondary | ICD-10-CM | POA: Diagnosis not present

## 2021-12-06 ENCOUNTER — Ambulatory Visit: Payer: BLUE CROSS/BLUE SHIELD | Admitting: Family Medicine

## 2021-12-23 ENCOUNTER — Ambulatory Visit: Payer: BLUE CROSS/BLUE SHIELD | Admitting: Family Medicine

## 2021-12-23 ENCOUNTER — Encounter: Payer: Self-pay | Admitting: Family Medicine

## 2021-12-23 VITALS — BP 139/84 | HR 63 | Ht 65.0 in | Wt 195.0 lb

## 2021-12-23 DIAGNOSIS — E7800 Pure hypercholesterolemia, unspecified: Secondary | ICD-10-CM

## 2021-12-23 DIAGNOSIS — I10 Essential (primary) hypertension: Secondary | ICD-10-CM | POA: Diagnosis not present

## 2021-12-23 NOTE — Progress Notes (Unsigned)
Subjective:    Patient ID: Ian Moore, male    DOB: 18-Sep-1965, 56 y.o.   MRN: [id number]  Cavon Nicolls is a 56 y.o. male presenting on 12/23/2021 for Hypertension   HPI  Knot bothering him on arm, likely Lipoma - Left inner arm elbow  CHRONIC HTN: Reports home BP checks with good results , BP 130s/80s at home Current Meds - Amlodipine '10mg'$  daily, HCTZ '25mg'$  daily   Reports good compliance, took meds today. Tolerating well, w/o complaints. Lifestyle: - Diet: reduced sodium intake - Exercise: increased exercise Denies CP, dyspnea, HA, edema, dizziness / lightheadedness   HYPERLIPIDEMIA: - Reports no concerns. Last lipid panel 03/2021, mild elevated LDL 105 - Currently taking Atorvastatin '80mg'$  nightly, tolerating well without side effects or myalgias   History of CVA Followed by [facility] Neurology Dr Melrose Nakayama   Health Maintenance:   Prostate CA Screening: No prior prostate CA screening. Currently asymptomatic . No known family history of prostate CA. Due for screening.   Colon CA Screening: Completed Colonoscopy 06/27/21. Next Dr Vicente Males [facility], next due 7 years, 2030      06/06/2021    8:28 AM 04/24/2021    8:08 AM 12/25/2016    3:01 PM  Depression screen PHQ 2/9  Decreased Interest 0 0 0  Down, Depressed, Hopeless 0 0 0  PHQ - 2 Score 0 0 0  Altered sleeping 0 0 0  Tired, decreased energy 0 0 0  Change in appetite 0 0 0  Feeling bad or failure about yourself  0 0 0  Trouble concentrating 0 0 0  Moving slowly or fidgety/restless 0 0 0  Suicidal thoughts 0 0 0  PHQ-9 Score 0 0 0  Difficult doing work/chores Not difficult at all Not difficult at all     Social History   Tobacco Use   Smoking status: Former   Smokeless tobacco: Never  Scientific laboratory technician Use: Never used  Substance Use Topics   Alcohol use: Never   Drug use: No    Review of Systems Per HPI unless specifically indicated above     Objective:    BP 139/84   Pulse 63   Ht '5\' 5"'$  (1.651  m)   Wt 195 lb (88.5 kg)   SpO2 98%   BMI 32.45 kg/m   Wt Readings from Last 3 Encounters:  12/23/21 195 lb (88.5 kg)  06/27/21 187 lb (84.8 kg)  06/06/21 187 lb 3.2 oz (84.9 kg)    Physical Exam Vitals and nursing note reviewed.  Constitutional:      General: He is not in acute distress.    Appearance: Normal appearance. He is well-developed. He is not diaphoretic.     Comments: Well-appearing, comfortable, cooperative  HENT:     Head: Normocephalic and atraumatic.  Eyes:     General:        Right eye: No discharge.        Left eye: No discharge.     Conjunctiva/sclera: Conjunctivae normal.  Neck:     Thyroid: No thyromegaly.  Cardiovascular:     Rate and Rhythm: Normal rate and regular rhythm.     Pulses: Normal pulses.     Heart sounds: Normal heart sounds. No murmur heard. Pulmonary:     Effort: Pulmonary effort is normal. No respiratory distress.     Breath sounds: Normal breath sounds. No wheezing or rales.  Musculoskeletal:        General: Normal range of motion.  Cervical back: Normal range of motion and neck supple.  Lymphadenopathy:     Cervical: No cervical adenopathy.  Skin:    General: Skin is warm and dry.     Findings: No erythema or rash.  Neurological:     Mental Status: He is alert and oriented to person, place, and time. Mental status is at baseline.  Psychiatric:        Mood and Affect: Mood normal.        Behavior: Behavior normal.        Thought Content: Thought content normal.     Comments: Well groomed, good eye contact, normal speech and thoughts       Results for orders placed or performed during the hospital encounter of 06/27/21  Surgical pathology  Result Value Ref Range   SURGICAL PATHOLOGY      SURGICAL PATHOLOGY CASE: [id number] PATIENT: Aundria Rud Surgical Pathology Report     Specimen Submitted: A. Colon polyp, ascending; cold snare B. Colon polyp, descending; cold snare  Clinical History: Colon cancer  screening Z12.11.  Polyps; hemorrhoids    DIAGNOSIS: A. COLON POLYP, ASCENDING; COLD SNARE: - TUBULAR ADENOMA. - NEGATIVE FOR HIGH-GRADE DYSPLASIA AND MALIGNANCY.  B. COLON POLYP, DESCENDING; COLD SNARE: - TUBULAR ADENOMA. - NEGATIVE FOR HIGH-GRADE DYSPLASIA AND MALIGNANCY.  GROSS DESCRIPTION: A. Labeled: Cold snare ascending colon polyp Received: Formalin Collection time: 10:16 AM on 06/27/2021 Placed into formalin time: 10:16 AM on 06/27/2021 Tissue fragment(s): Multiple Size: Aggregate, 1 x 0.5 x 0.2 cm Description: Received are 2 fragments of tan soft tissue admixed with intestinal debris.  The ratio of soft tissue to intestinal debris is 70: 30. Entirely submitted in 1 cassette.  B. Labeled: Cold snare descending colon polyp R eceived: Formalin Collection time: 10:24 AM on 06/27/2021 Placed into formalin time: 10:24 AM on 06/27/2021 Tissue fragment(s): Multiple Size: Aggregate, 2.5 x 0.4 x 0.2 cm Description: Received are 2 fragments of tan soft tissue admixed with intestinal debris.  The ratio of soft tissue to intestinal debris is 30: 70. Entirely submitted in 1 cassette.  RB 06/27/2021  Final Diagnosis performed by Allena Napoleon, MD.   Electronically signed 06/28/2021 9:57:48AM The electronic signature indicates that the named Attending Pathologist has evaluated the specimen Technical component performed at [facility], [address] [place], [place] [address] Lab: [phone] Dir: Rush Farmer, MD, MMM  Professional component performed at [facility], [facility], [place], [place], [place] [address] Lab: [phone] Dir: Kathi Simpers, MD       Assessment & Plan:   Problem List Items Addressed This Visit     Elevated LDL cholesterol level   Essential hypertension - Primary   Keep on current BP medication. No change today  Knot on left arm appears to be a Lipoma. Soft knot under skin. Likely interfering with your elbow motion and can  cause some discomfort.  I recommend future Shingles vaccine 2 doses, 2-6 months apart, can do here at nurse visit or pharmacy check cost / allergy. They can counsel you further on the allergy question    No orders of the defined types were placed in this encounter.    Follow up plan: Return in about 6 months (around 06/23/2022) for 6 month fasting lab only then 1 week later Annual Physical.  Future labs ordered for 05/2022   Nobie Putnam, [facility] 12/23/2021, 4:28 PM

## 2021-12-23 NOTE — Patient Instructions (Addendum)
Thank you for coming to the office today.  Keep on current BP medication. No change today  Knot on left arm appears to be a Lipoma. Soft knot under skin. Likely interfering with your elbow motion and can cause some discomfort.  I recommend future Shingles vaccine 2 doses, 2-6 months apart, can do here at nurse visit or pharmacy check cost / allergy. They can counsel you further on the allergy question  DUE for FASTING BLOOD WORK (no food or drink after midnight before the lab appointment, only water or coffee without cream/sugar on the morning of)  SCHEDULE "Lab Only" visit in the morning at the clinic for lab draw in 6 MONTHS   - Make sure Lab Only appointment is at about 1 week before your next appointment, so that results will be available  For Lab Results, once available within 2-3 days of blood draw, you can can log in to MyChart online to view your results and a brief explanation. Also, we can discuss results at next follow-up visit.    Please schedule a Follow-up Appointment to: Return in about 6 months (around 06/23/2022) for 6 month fasting lab only then 1 week later Annual Physical.  If you have any other questions or concerns, please feel free to call the office or send a message through [place]. You may also schedule an earlier appointment if necessary.  Additionally, you may be receiving a survey about your experience at our office within a few days to 1 week by e-mail or mail. We value your feedback.  Nobie Putnam, DO [place]

## 2021-12-24 ENCOUNTER — Other Ambulatory Visit: Payer: Self-pay | Admitting: Family Medicine

## 2021-12-24 DIAGNOSIS — Z125 Encounter for screening for malignant neoplasm of prostate: Secondary | ICD-10-CM

## 2021-12-24 DIAGNOSIS — I10 Essential (primary) hypertension: Secondary | ICD-10-CM

## 2021-12-24 DIAGNOSIS — E7800 Pure hypercholesterolemia, unspecified: Secondary | ICD-10-CM

## 2021-12-24 DIAGNOSIS — Z0000 Encounter for general adult medical examination without abnormal findings: Secondary | ICD-10-CM

## 2021-12-24 DIAGNOSIS — I6930 Unspecified sequelae of cerebral infarction: Secondary | ICD-10-CM

## 2021-12-24 DIAGNOSIS — E669 Obesity, unspecified: Secondary | ICD-10-CM

## 2021-12-24 DIAGNOSIS — R7309 Other abnormal glucose: Secondary | ICD-10-CM

## 2022-05-07 ENCOUNTER — Other Ambulatory Visit: Payer: Self-pay | Admitting: Family Medicine

## 2022-05-07 DIAGNOSIS — I10 Essential (primary) hypertension: Secondary | ICD-10-CM

## 2022-05-07 DIAGNOSIS — I6930 Unspecified sequelae of cerebral infarction: Secondary | ICD-10-CM

## 2022-05-07 NOTE — Telephone Encounter (Signed)
Requested medication (s) are due for refill today: expired medication  Requested medication (s) are on the active medication list: yes  Last refill:  04/24/21 #90 3 refills  Future visit scheduled: yes in 1 month  Notes to clinic:  expired medications. Do you want to renew Rxs?     Requested Prescriptions  Pending Prescriptions Disp Refills   atorvastatin (LIPITOR) 80 MG tablet [Pharmacy Med Name: ATORVASTATIN 80 MG TABLET] 90 tablet 3    Sig: TAKE 1 TABLET BY MOUTH EVERY DAY     Cardiovascular:  Antilipid - Statins Failed - 05/07/2022  4:42 AM      Failed - Lipid Panel in normal range within the last 12 months    Cholesterol, Total  Date Value Ref Range Status  01/12/2017 154 100 - 199 mg/dL Final   Cholesterol  Date Value Ref Range Status  06/06/2021 96 <200 mg/dL Final   LDL Cholesterol (Calc)  Date Value Ref Range Status  06/06/2021 38 mg/dL (calc) Final    Comment:    Reference range: <100 . Desirable range <100 mg/dL for primary prevention;   <70 mg/dL for patients with CHD or diabetic patients  with > or = 2 CHD risk factors. Marland Kitchen LDL-C is now calculated using the Martin-Hopkins  calculation, which is a validated novel method providing  better accuracy than the Friedewald equation in the  estimation of LDL-C.  Cresenciano Genre et al. Annamaria Helling. 4650;354(65): 2061-2068  (http://education.[employer].com/faq/FAQ164)    HDL  Date Value Ref Range Status  06/06/2021 43 > OR = 40 mg/dL Final  01/12/2017 41 >39 mg/dL Final   Triglycerides  Date Value Ref Range Status  06/06/2021 69 <150 mg/dL Final         Passed - Patient is not pregnant      Passed - Valid encounter within last 12 months    Recent Outpatient Visits           4 months ago Essential hypertension   [place], DO   11 months ago Annual physical exam   [facility] Olin Hauser, DO   1 year ago History of  hemorrhagic cerebrovascular accident (CVA) with residual deficit   [facility] Olin Hauser, DO   2 years ago Essential hypertension   [place], FNP   2 years ago Encounter to establish care with new doctor   [facility] [place], Lupita Raider, FNP       Future Appointments             In 1 month [place], Devonne Doughty, [facility]             hydrochlorothiazide (HYDRODIURIL) 25 MG tablet [Pharmacy Med Name: HYDROCHLOROTHIAZIDE 25 MG TAB] 90 tablet 3    Sig: Take 1 tablet (25 mg total) by mouth daily.     Cardiovascular: Diuretics - Thiazide Failed - 05/07/2022  4:42 AM      Failed - Cr in normal range and within 180 days    Creat  Date Value Ref Range Status  06/06/2021 0.94 0.70 - 1.30 mg/dL Final         Failed - K in normal range and within 180 days    Potassium  Date Value Ref Range Status  06/06/2021 3.3 (L) 3.5 - 5.3 mmol/L Final  Failed - Na in normal range and within 180 days    Sodium  Date Value Ref Range Status  06/06/2021 137 135 - 146 mmol/L Final  01/12/2017 143 134 - 144 mmol/L Final         Passed - Last BP in normal range    BP Readings from Last 1 Encounters:  12/23/21 139/84         Passed - Valid encounter within last 6 months    Recent Outpatient Visits           4 months ago Essential hypertension   [place], DO   11 months ago Annual physical exam   [facility] Olin Hauser, DO   1 year ago History of hemorrhagic cerebrovascular accident (CVA) with residual deficit   [facility] [place], Devonne Doughty, DO   2 years ago Essential hypertension   [place], FNP   2 years ago Encounter to establish care with new doctor    [facility] [place], Lupita Raider, FNP       Future Appointments             In 1 month [place], Devonne Doughty, [facility], [facility]             amLODipine (Burns) 10 MG tablet [Pharmacy Med Name: AMLODIPINE BESYLATE 10 MG TAB] 90 tablet 3    Sig: TAKE 1 TABLET BY MOUTH EVERY DAY     Cardiovascular: Calcium Channel Blockers 2 Passed - 05/07/2022  4:42 AM      Passed - Last BP in normal range    BP Readings from Last 1 Encounters:  12/23/21 139/84         Passed - Last Heart Rate in normal range    Pulse Readings from Last 1 Encounters:  12/23/21 63         Passed - Valid encounter within last 6 months    Recent Outpatient Visits           4 months ago Essential hypertension   [place], DO   11 months ago Annual physical exam   [facility] [place], Devonne Doughty, DO   1 year ago History of hemorrhagic cerebrovascular accident (CVA) with residual deficit   [facility] [place], Devonne Doughty, DO   2 years ago Essential hypertension   [place], FNP   2 years ago Encounter to establish care with new doctor   [facility] [place], Lupita Raider, FNP       Future Appointments             In 1 month [place], Devonne Doughty, [facility], [facility]

## 2022-06-07 ENCOUNTER — Other Ambulatory Visit: Payer: Self-pay | Admitting: Family Medicine

## 2022-06-07 DIAGNOSIS — E876 Hypokalemia: Secondary | ICD-10-CM

## 2022-06-09 NOTE — Telephone Encounter (Signed)
Requested medication (s) are due for refill today: yes  Requested medication (s) are on the active medication list:  yes  Last refill:  06/07/21 #90 3 RF  Future visit scheduled: yes  Notes to clinic:  overdue lab work   Requested Prescriptions  Pending Prescriptions Disp Refills   KLOR-CON M20 20 MEQ tablet [Pharmacy Med Name: KLOR-CON M20 TABLET] 90 tablet 3    Sig: TAKE [place]     Endocrinology:  Minerals - Potassium Supplementation Failed - 06/07/2022  1:08 AM      Failed - K in normal range and within 360 days    Potassium  Date Value Ref Range Status  06/06/2021 3.3 (L) 3.5 - 5.3 mmol/L Final         Failed - Cr in normal range and within 360 days    Creat  Date Value Ref Range Status  06/06/2021 0.94 0.70 - 1.30 mg/dL Final         Passed - Valid encounter within last 12 months    Recent Outpatient Visits           5 months ago Essential hypertension   [place], DO   1 year ago Annual physical exam   [facility] Olin Hauser, DO   1 year ago History of hemorrhagic cerebrovascular accident (CVA) with residual deficit   [facility] Olin Hauser, DO   2 years ago Essential hypertension   [place], FNP   2 years ago Encounter to establish care with new doctor   [facility] [place], Lupita Raider, FNP       Future Appointments             In 2 weeks [place], Devonne Doughty, DO [facility], [facility]

## 2022-06-19 ENCOUNTER — Other Ambulatory Visit: Payer: BLUE CROSS/BLUE SHIELD

## 2022-06-19 ENCOUNTER — Other Ambulatory Visit: Payer: Self-pay

## 2022-06-19 DIAGNOSIS — Z0000 Encounter for general adult medical examination without abnormal findings: Secondary | ICD-10-CM

## 2022-06-19 DIAGNOSIS — R7309 Other abnormal glucose: Secondary | ICD-10-CM

## 2022-06-19 DIAGNOSIS — Z125 Encounter for screening for malignant neoplasm of prostate: Secondary | ICD-10-CM | POA: Diagnosis not present

## 2022-06-19 DIAGNOSIS — E669 Obesity, unspecified: Secondary | ICD-10-CM

## 2022-06-19 DIAGNOSIS — I10 Essential (primary) hypertension: Secondary | ICD-10-CM

## 2022-06-19 DIAGNOSIS — E7800 Pure hypercholesterolemia, unspecified: Secondary | ICD-10-CM

## 2022-06-20 LAB — LIPID PANEL
Cholesterol: 110 mg/dL (ref <200)
HDL: 46 mg/dL (ref >=40)
LDL Cholesterol (Calc): 49 mg/dL (calc)
Non-HDL Cholesterol (Calc): 64 mg/dL (calc) (ref <130)
Total CHOL/HDL Ratio: 2.4 (calc) (ref <5.0)
Triglycerides: 66 mg/dL (ref <150)

## 2022-06-20 LAB — CBC WITH DIFFERENTIAL/PLATELET
Absolute Monocytes: 510 cells/uL (ref 200–950)
Basophils Absolute: 57 cells/uL (ref 0–200)
Basophils Relative: 0.7 %
Eosinophils Absolute: 130 cells/uL (ref 15–500)
Eosinophils Relative: 1.6 %
HCT: 42.8 % (ref 38.5–50.0)
Hemoglobin: 14.2 g/dL (ref 13.2–17.1)
Lymphs Abs: 2138 cells/uL (ref 850–3900)
MCH: 29.3 pg (ref 27.0–33.0)
MCHC: 33.2 g/dL (ref 32.0–36.0)
MCV: 88.2 fL (ref 80.0–100.0)
MPV: 11.4 fL (ref 7.5–12.5)
Monocytes Relative: 6.3 %
Neutro Abs: 5265 cells/uL (ref 1500–7800)
Neutrophils Relative %: 65 %
Platelets: 213 10*3/uL (ref 140–400)
RBC: 4.85 10*6/uL (ref 4.20–5.80)
RDW: 13 % (ref 11.0–15.0)
Total Lymphocyte: 26.4 %
WBC: 8.1 10*3/uL (ref 3.8–10.8)

## 2022-06-20 LAB — PSA: PSA: 1.28 ng/mL (ref <=4.00)

## 2022-06-20 LAB — COMPLETE METABOLIC PANEL WITH GFR
AG Ratio: 1.3 (calc) (ref 1.0–2.5)
ALT: 22 U/L (ref 9–46)
AST: 22 U/L (ref 10–35)
Albumin: 4.4 g/dL (ref 3.6–5.1)
Alkaline phosphatase (APISO): 63 U/L (ref 35–144)
BUN: 17 mg/dL (ref 7–25)
CO2: 28 mmol/L (ref 20–32)
Calcium: 9 mg/dL (ref 8.6–10.3)
Chloride: 102 mmol/L (ref 98–110)
Creat: 0.78 mg/dL (ref 0.70–1.30)
Globulin: 3.5 g/dL (calc) (ref 1.9–3.7)
Glucose, Bld: 106 mg/dL — ABNORMAL HIGH (ref 65–99)
Potassium: 3.4 mmol/L — ABNORMAL LOW (ref 3.5–5.3)
Sodium: 139 mmol/L (ref 135–146)
Total Bilirubin: 1 mg/dL (ref 0.2–1.2)
Total Protein: 7.9 g/dL (ref 6.1–8.1)
eGFR: 103 mL/min/{1.73_m2} (ref >=60)

## 2022-06-20 LAB — HEMOGLOBIN A1C
Hgb A1c MFr Bld: 6.2 % of total Hgb — ABNORMAL HIGH (ref <5.7)
Mean Plasma Glucose: 131 mg/dL
eAG (mmol/L): 7.3 mmol/L

## 2022-06-20 LAB — TSH: TSH: 1.8 mIU/L (ref 0.40–4.50)

## 2022-06-25 ENCOUNTER — Ambulatory Visit (INDEPENDENT_AMBULATORY_CARE_PROVIDER_SITE_OTHER): Payer: BLUE CROSS/BLUE SHIELD | Admitting: Family Medicine

## 2022-06-25 ENCOUNTER — Encounter: Payer: Self-pay | Admitting: Family Medicine

## 2022-06-25 VITALS — BP 138/80 | HR 60 | Ht 65.0 in | Wt 199.2 lb

## 2022-06-25 DIAGNOSIS — Z23 Encounter for immunization: Secondary | ICD-10-CM

## 2022-06-25 DIAGNOSIS — E7800 Pure hypercholesterolemia, unspecified: Secondary | ICD-10-CM

## 2022-06-25 DIAGNOSIS — I6930 Unspecified sequelae of cerebral infarction: Secondary | ICD-10-CM

## 2022-06-25 DIAGNOSIS — R7309 Other abnormal glucose: Secondary | ICD-10-CM | POA: Diagnosis not present

## 2022-06-25 DIAGNOSIS — E669 Obesity, unspecified: Secondary | ICD-10-CM | POA: Diagnosis not present

## 2022-06-25 DIAGNOSIS — Z0000 Encounter for general adult medical examination without abnormal findings: Secondary | ICD-10-CM | POA: Diagnosis not present

## 2022-06-25 DIAGNOSIS — I10 Essential (primary) hypertension: Secondary | ICD-10-CM

## 2022-06-25 MED ORDER — SHINGRIX 50 MCG/0.5ML IM SUSR: INTRAMUSCULAR | 1 refills | Status: DC

## 2022-06-25 NOTE — Progress Notes (Signed)
Subjective:    Patient ID: Ian Moore, male    DOB: 1965-06-25, 57 y.o.   MRN: [id number]  Ian Moore is a 57 y.o. male presenting on 06/25/2022 for Annual Exam  History provided in [place]. Interpretation by patient's daughter.  HPI  Here for Annual Physical and Lab Orders   CHRONIC HTN: Reports home BP checks with good results  Current Meds - Amlodipine 10mg  daily, HCTZ 25mg  daily   Reports good compliance, took meds today. Tolerating well, w/o complaints. Lifestyle: - Diet: reduced sodium intake - Exercise: increased exercise Denies CP, dyspnea, HA, edema, dizziness / lightheadedness  Elevated A1c A1c 6.2, elevated, prior 5.9 to 6.0 Meds: None Lifestyle: - Diet (admits drinking sodas and some sweet tea and goal to limit carb foods)  Denies hypoglycemia, polyuria, visual changes, numbness or tingling.    HYPERLIPIDEMIA: - Reports no concerns. Last lipid panel 05/2022, controlled LDL at 49 - Currently taking Atorvastatin 80mg  nightly, tolerating well without side effects or myalgias   History of CVA W residual deficit Left upper extremity reduced sensation Followed by [facility] Neurology Dr Melrose Nakayama   Left Neck pain Admits localized left upper neck pain spasm strain worse with sleeping    Health Maintenance:  Due Shingrix vaccine, will check with insurance first.    Prostate CA Screening: PSA 1.28 (negative, 05/2022). Currently asymptomatic during day no problems, but at night has nocturia. No known family history of prostate CA he does consume water and caffeine sodas in evening fairly often   Colon CA Screening: Last Colonoscopy 06/27/21 2 polyps, negative otherwise, repeat 7 years.      06/25/2022    4:19 PM 06/06/2021    8:28 AM 04/24/2021    8:08 AM  Depression screen PHQ 2/9  Decreased Interest 0 0 0  Down, Depressed, Hopeless 0 0 0  PHQ - 2 Score 0 0 0  Altered sleeping  0 0  Tired, decreased energy  0 0  Change in appetite  0 0  Feeling bad or  failure about yourself   0 0  Trouble concentrating  0 0  Moving slowly or fidgety/restless  0 0  Suicidal thoughts  0 0  PHQ-9 Score  0 0  Difficult doing work/chores  Not difficult at all Not difficult at all    Past Medical History:  Diagnosis Date   Hypertension    Stroke [facility])    Past Surgical History:  Procedure Laterality Date   COLONOSCOPY WITH PROPOFOL N/A 06/27/2021   Procedure: COLONOSCOPY WITH PROPOFOL;  Surgeon: Jonathon Bellows, MD;  Location: [facility] ENDOSCOPY;  Service: Gastroenterology;  Laterality: N/A;  SPANISH INTERPRETER   NO PAST SURGERIES     Social History   Socioeconomic History   Marital status: Married    Spouse name: Not on file   Number of children: Not on file   Years of education: Not on file   Highest education level: Not on file  Occupational History   Not on file  Tobacco Use   Smoking status: Former   Smokeless tobacco: Never  Vaping Use   Vaping Use: Never used  Substance and Sexual Activity   Alcohol use: Never   Drug use: No   Sexual activity: Not on file  Other Topics Concern   Not on file  Social History Narrative   Not on file   Social Determinants of Health   Financial Resource Strain: Not on file  Food Insecurity: Not on file  Transportation Needs: Not on file  Physical Activity: Not on file  Stress: Not on file  Social Connections: Not on file  Intimate Partner Violence: Not on file   History reviewed. No pertinent family history. Current Outpatient Medications on File Prior to Visit  Medication Sig   amLODipine (NORVASC) 10 MG tablet TAKE 1 TABLET BY MOUTH EVERY DAY   aspirin EC 81 MG tablet Take 81 mg by mouth daily. Swallow whole.   atorvastatin (LIPITOR) 80 MG tablet TAKE 1 TABLET BY MOUTH EVERY DAY   hydrochlorothiazide (HYDRODIURIL) 25 MG tablet TAKE 1 TABLET (25 MG TOTAL) BY MOUTH DAILY.   KLOR-CON M20 20 MEQ tablet TAKE 1 TABLET BY MOUTH EVERY DAY   No current facility-administered medications on file prior to  visit.    Review of Systems  Constitutional:  Negative for activity change, appetite change, chills, diaphoresis, fatigue and fever.  HENT:  Negative for congestion and hearing loss.   Eyes:  Negative for visual disturbance.  Respiratory:  Negative for cough, chest tightness, shortness of breath and wheezing.   Cardiovascular:  Negative for chest pain, palpitations and leg swelling.  Gastrointestinal:  Negative for abdominal pain, constipation, diarrhea, nausea and vomiting.  Genitourinary:  Negative for dysuria, frequency and hematuria.  Musculoskeletal:  Positive for neck pain. Negative for arthralgias.  Skin:  Negative for rash.  Neurological:  Negative for dizziness, weakness, light-headedness, numbness and headaches.  Hematological:  Negative for adenopathy.  Psychiatric/Behavioral:  Negative for behavioral problems, dysphoric mood and sleep disturbance.    Per HPI unless specifically indicated above      Objective:    BP 138/80 (BP Location: Left Arm, Cuff Size: Normal)   Pulse 60   Ht 5\' 5"  (1.651 m)   Wt 199 lb 3.2 oz (90.4 kg)   SpO2 99%   BMI 33.15 kg/m   Wt Readings from Last 3 Encounters:  06/25/22 199 lb 3.2 oz (90.4 kg)  12/23/21 195 lb (88.5 kg)  06/27/21 187 lb (84.8 kg)    Physical Exam Vitals and nursing note reviewed.  Constitutional:      General: He is not in acute distress.    Appearance: He is well-developed. He is not diaphoretic.     Comments: Well-appearing, comfortable, cooperative  HENT:     Head: Normocephalic and atraumatic.  Eyes:     General:        Right eye: No discharge.        Left eye: No discharge.     Conjunctiva/sclera: Conjunctivae normal.     Pupils: Pupils are equal, round, and reactive to light.  Neck:     Thyroid: No thyromegaly.  Cardiovascular:     Rate and Rhythm: Normal rate and regular rhythm.     Pulses: Normal pulses.     Heart sounds: Normal heart sounds. No murmur heard. Pulmonary:     Effort: Pulmonary  effort is normal. No respiratory distress.     Breath sounds: Normal breath sounds. No wheezing or rales.  Abdominal:     General: Bowel sounds are normal. There is no distension.     Palpations: Abdomen is soft. There is no mass.     Tenderness: There is no abdominal tenderness.  Musculoskeletal:        General: Normal range of motion.     Cervical back: Normal range of motion and neck supple. Tenderness (slight muscle spasm and tenderness left upper paraspinal cervical spine) present.     Right lower leg: No edema.     Left  lower leg: No edema.     Comments: Upper / Lower Extremities: - Normal muscle tone, strength bilateral upper extremities 5/5, lower extremities 5/5  Lymphadenopathy:     Cervical: No cervical adenopathy.  Skin:    General: Skin is warm and dry.     Findings: No erythema or rash.  Neurological:     Mental Status: He is alert and oriented to person, place, and time.     Comments: Distal sensation intact to light touch all extremities  Psychiatric:        Mood and Affect: Mood normal.        Behavior: Behavior normal.        Thought Content: Thought content normal.     Comments: Well groomed, good eye contact, normal speech and thoughts      Results for orders placed or performed in visit on 06/19/22  TSH  Result Value Ref Range   TSH 1.80 0.40 - 4.50 mIU/L  PSA  Result Value Ref Range   PSA 1.28 < OR = 4.00 ng/mL  Hemoglobin A1c  Result Value Ref Range   Hgb A1c MFr Bld 6.2 (H) <5.7 % of total Hgb   Mean Plasma Glucose 131 mg/dL   eAG (mmol/L) 7.3 mmol/L  Lipid panel  Result Value Ref Range   Cholesterol 110 <200 mg/dL   HDL 46 > OR = 40 mg/dL   Triglycerides 66 <150 mg/dL   LDL Cholesterol (Calc) 49 mg/dL (calc)   Total CHOL/HDL Ratio 2.4 <5.0 (calc)   Non-HDL Cholesterol (Calc) 64 <130 mg/dL (calc)  CBC with Differential/Platelet  Result Value Ref Range   WBC 8.1 3.8 - 10.8 Thousand/uL   RBC 4.85 4.20 - 5.80 Million/uL   Hemoglobin 14.2 13.2  - 17.1 g/dL   HCT 42.8 38.5 - 50.0 %   MCV 88.2 80.0 - 100.0 fL   MCH 29.3 27.0 - 33.0 pg   MCHC 33.2 32.0 - 36.0 g/dL   RDW 13.0 11.0 - 15.0 %   Platelets 213 140 - 400 Thousand/uL   MPV 11.4 7.5 - 12.5 fL   Neutro Abs 5,265 1,500 - 7,800 cells/uL   Lymphs Abs 2,138 850 - 3,900 cells/uL   Absolute Monocytes 510 200 - 950 cells/uL   Eosinophils Absolute 130 15 - 500 cells/uL   Basophils Absolute 57 0 - 200 cells/uL   Neutrophils Relative % 65 %   Total Lymphocyte 26.4 %   Monocytes Relative 6.3 %   Eosinophils Relative 1.6 %   Basophils Relative 0.7 %  COMPLETE METABOLIC PANEL WITH GFR  Result Value Ref Range   Glucose, Bld 106 (H) 65 - 99 mg/dL   BUN 17 7 - 25 mg/dL   Creat 0.78 0.70 - 1.30 mg/dL   eGFR 103 > OR = 60 mL/min/1.96m2   BUN/Creatinine Ratio SEE NOTE: 6 - 22 (calc)   Sodium 139 135 - 146 mmol/L   Potassium 3.4 (L) 3.5 - 5.3 mmol/L   Chloride 102 98 - 110 mmol/L   CO2 28 20 - 32 mmol/L   Calcium 9.0 8.6 - 10.3 mg/dL   Total Protein 7.9 6.1 - 8.1 g/dL   Albumin 4.4 3.6 - 5.1 g/dL   Globulin 3.5 1.9 - 3.7 g/dL (calc)   AG Ratio 1.3 1.0 - 2.5 (calc)   Total Bilirubin 1.0 0.2 - 1.2 mg/dL   Alkaline phosphatase (APISO) 63 35 - 144 U/L   AST 22 10 - 35 U/L   ALT 22 9 -  46 U/L      Assessment & Plan:   Problem List Items Addressed This Visit     Elevated hemoglobin A1c    Elevated A1c to 6.2 Concern with obesity, HTN, HLD  Plan:  1. Not on any therapy currently  2. Encourage improved lifestyle - low carb, low sugar diet, reduce portion size, continue improving regular exercise 3. Follow-up 6 month repeat A1c POC       Elevated LDL cholesterol level    Controlled cholesterol on statin lifestyle  Plan: 1. Continue current meds - Atorvastatin 80mg  daily 2. Continue ASA 81mg  for secondary ASCVD risk reduction 3. Encourage improved lifestyle - low carb/cholesterol, reduce portion size, continue improving regular exercise       Essential hypertension     Initial mild elevated BP SBP, then repeat manual improved. Mostly Controlled BP. Complication with Hx CVA   Plan:  1. Continue Amlodipine 10mg  daily, HCTZ 25mg  daily 2. Encourage improved lifestyle - low sodium diet, regular exercise 3. Continue monitor BP outside office, bring readings to next visit, if persistently >140/90 or new symptoms notify office sooner  Discussion on thiazide may not be best fit going forward, HypoK, he is on K, has cramps in muscles, and nocturia. We can consider swap to ARB in future to optimize BP and reduce these side effects      History of hemorrhagic cerebrovascular accident (CVA) with residual deficit   Other Visit Diagnoses     Annual physical exam    -  Primary   Obesity (BMI 30.0-34.9)       Need for shingles vaccine       Relevant Medications   SHINGRIX injection       Nocturia Recommend reducing sodas and tea in evening. Avoid bladder irritants Okay to have water, but avoid drinking any liquid past approximately after dinner / 8 pm If the night time urination does not improve, then we can consider stopping and switching the fluid medication, would change from Thiazide to ARB in future for BP, this can also reduce cramping and normalize K  Neck spasm Likely muscle strain spasm Conservative therapy advised, topical heat massage  No orders of the defined types were placed in this encounter.    Meds ordered this encounter  Medications   SHINGRIX injection    Sig: Inject 0.5 mL into muscle for shingles vaccine. Repeat dose in 2-6 months.    Dispense:  0.5 mL    Refill:  1    Follow up plan: Return in about 6 months (around 12/26/2022) for 6 month PreDM A1c HTN.   Nobie Putnam, [facility] 06/25/2022, 4:10 PM

## 2022-06-25 NOTE — Patient Instructions (Addendum)
Thank you for coming to the office today.  Leg cramps - Try spoonful of yellow mustard to relieve leg cramps or try daily to prevent the problem  - OTC natural option is Hyland's Leg Cramps (Dissolving tablet) take as needed for muscle cramps  ----------------------  For neck likely muscle spasm and strain, it should improve with some topical moist warm heat and muscle massage and rest   Recent Labs    06/19/22 0804  HGBA1C 6.2*   Keep improving the blood sugar, avoiding high carbs.  ----------  Recommend reducing sodas and tea in evening. Okay to have water, but avoid drinking any liquid past approximately after dinner / 8 pm If the night time urination does not improve, then we can consider stopping and switching the fluid medication.   Diet Recommendations for Preventing Diabetes   REDUCE Starchy (carb) foods include: Bread, rice, pasta, potatoes, corn, crackers, bagels, muffins, all baked goods.   FRUITS - LIMIT these HIGH sugar/carb fruits = Pineapple, Watermelon, Bananas - OKAY with these MEDIUM sugar/carb fruits = Citrus, Oranges, Grapes - PREFER these LOW sugar/carb fruits = Apples, Berries, Pears, Plums  Protein foods include: Meat, fish, poultry, eggs, dairy foods, and beans such as pinto and kidney beans (beans also provide carbohydrate).   1. Eat at least 3 meals and 1-2 snacks per day. Never go more than 4-5 hours while awake without eating.   2. Limit starchy foods to TWO per meal and ONE per snack. ONE portion of a starchy  food is equal to the following:   - ONE slice of bread (or its equivalent, such as half of a hamburger bun).   - 1/2 cup of a "scoopable" starchy food such as potatoes or rice.   - 1 OUNCE (28 grams) of starchy snacks (crackers or pretzels, look on label).   - 15 grams of carbohydrate as shown on food label.   3. Both lunch and dinner should include a protein food, a carb food, and vegetables.   - Obtain twice as many veg's as protein  or carbohydrate foods for both lunch and dinner.   - Try to keep frozen veg's on hand for a quick vegetable serving.     - Fresh or frozen veg's are best.   4. Breakfast should always include protein.     Please schedule a Follow-up Appointment to: Return in about 6 months (around 12/26/2022) for 6 month PreDM A1c HTN.  If you have any other questions or concerns, please feel free to call the office or send a message through [place]. You may also schedule an earlier appointment if necessary.  Additionally, you may be receiving a survey about your experience at our office within a few days to 1 week by e-mail or mail. We value your feedback.  Nobie Putnam, DO [facility]

## 2022-06-25 NOTE — Assessment & Plan Note (Signed)
Controlled cholesterol on statin lifestyle  Plan: 1. Continue current meds - Atorvastatin 80mg  daily 2. Continue ASA 81mg  for secondary ASCVD risk reduction 3. Encourage improved lifestyle - low carb/cholesterol, reduce portion size, continue improving regular exercise

## 2022-06-25 NOTE — Assessment & Plan Note (Signed)
Elevated A1c to 6.2 Concern with obesity, HTN, HLD  Plan:  1. Not on any therapy currently  2. Encourage improved lifestyle - low carb, low sugar diet, reduce portion size, continue improving regular exercise 3. Follow-up 6 month repeat A1c POC

## 2022-06-25 NOTE — Assessment & Plan Note (Signed)
Initial mild elevated BP SBP, then repeat manual improved. Mostly Controlled BP. Complication with Hx CVA   Plan:  1. Continue Amlodipine 10mg  daily, HCTZ 25mg  daily 2. Encourage improved lifestyle - low sodium diet, regular exercise 3. Continue monitor BP outside office, bring readings to next visit, if persistently >140/90 or new symptoms notify office sooner  Discussion on thiazide may not be best fit going forward, HypoK, he is on K, has cramps in muscles, and nocturia. We can consider swap to ARB in future to optimize BP and reduce these side effects

## 2022-11-25 DIAGNOSIS — R200 Anesthesia of skin: Secondary | ICD-10-CM | POA: Diagnosis not present

## 2022-11-25 DIAGNOSIS — R202 Paresthesia of skin: Secondary | ICD-10-CM | POA: Diagnosis not present

## 2022-11-25 DIAGNOSIS — I619 Nontraumatic intracerebral hemorrhage, unspecified: Secondary | ICD-10-CM | POA: Diagnosis not present

## 2023-01-02 ENCOUNTER — Other Ambulatory Visit: Payer: Self-pay | Admitting: Family Medicine

## 2023-01-02 ENCOUNTER — Encounter: Payer: Self-pay | Admitting: Family Medicine

## 2023-01-02 ENCOUNTER — Ambulatory Visit: Payer: BLUE CROSS/BLUE SHIELD | Admitting: Family Medicine

## 2023-01-02 VITALS — BP 110/72 | HR 60 | Ht 65.0 in | Wt 195.0 lb

## 2023-01-02 DIAGNOSIS — I10 Essential (primary) hypertension: Secondary | ICD-10-CM

## 2023-01-02 DIAGNOSIS — Z0000 Encounter for general adult medical examination without abnormal findings: Secondary | ICD-10-CM

## 2023-01-02 DIAGNOSIS — R7309 Other abnormal glucose: Secondary | ICD-10-CM | POA: Diagnosis not present

## 2023-01-02 DIAGNOSIS — L739 Follicular disorder, unspecified: Secondary | ICD-10-CM

## 2023-01-02 DIAGNOSIS — E7800 Pure hypercholesterolemia, unspecified: Secondary | ICD-10-CM

## 2023-01-02 DIAGNOSIS — Z125 Encounter for screening for malignant neoplasm of prostate: Secondary | ICD-10-CM

## 2023-01-02 DIAGNOSIS — I6930 Unspecified sequelae of cerebral infarction: Secondary | ICD-10-CM

## 2023-01-02 DIAGNOSIS — E66811 Obesity, class 1: Secondary | ICD-10-CM

## 2023-01-02 LAB — POCT GLYCOSYLATED HEMOGLOBIN (HGB A1C): Hemoglobin A1C: 5.8 % — AB (ref 4.0–5.6)

## 2023-01-02 MED ORDER — MUPIROCIN 2 % EX OINT: 1.0000 | TOPICAL_OINTMENT | Freq: Two times a day (BID) | CUTANEOUS | 2 refills | Status: AC

## 2023-01-02 MED ORDER — DOXYCYCLINE HYCLATE 100 MG PO TABS: 100.0000 mg | ORAL_TABLET | Freq: Two times a day (BID) | ORAL | 0 refills | Status: DC

## 2023-01-02 NOTE — Progress Notes (Signed)
Subjective:    Patient ID: Ian Moore, male    DOB: 1965-07-03, 57 y.o.   MRN: [id number]  Ian Moore is a 57 y.o. male presenting on 01/02/2023 for Follow-up (A1C/)   HPI  Discussed the use of AI scribe software for clinical note transcription with the patient, who gave verbal consent to proceed.     The patient, with a history of stroke, was last seen in March and has been following up with a neurologist, most recently in August. He has been advised to continue taking aspirin and to follow up annually with the neurologist.  The patient has been managing his prediabetic condition, with a blood sugar level of 5.8, down from 6.2 in March. He attributes this improvement to a reduction in daily soda consumption.  He has also been experiencing a persistent rash on his scalp for the past 15 years, which appears to be a bacterial infection. The rash is not currently painful, but it becomes tender when it flares up. Over-the-counter medications have only provided temporary relief.  The patient is also on medication for blood pressure and cholesterol, and a fluid pill for potential swelling.       CHRONIC HTN: Reports home BP checks with good results 130/80s. Asking about adjusting med doses in future. Current Meds - Amlodipine 10mg  daily, HCTZ 25mg  daily   + K supplement daily Reports good compliance, took meds today. Tolerating well, w/o complaints. Lifestyle: - Diet: reduced sodium intake - Exercise: increased exercise Denies CP, dyspnea, HA, edema, dizziness / lightheadedness    Elevated A1c / Pre Diabetes Today A1c 5.8 improved from 6.2 Improved diet Reduced sodas Meds: None Lifestyle: Denies hypoglycemia, polyuria, visual changes, numbness or tingling.   HYPERLIPIDEMIA: - Reports no concerns. Last lipid panel 05/2022, controlled LDL at 49 - Currently taking Atorvastatin 80mg  nightly, tolerating well without side effects or myalgias   History of CVA W  residual deficit Left upper extremity reduced sensation Followed by [facility] Neurology Dr Malvin Johns Recently seen 10/2022 On continue ASA 81mg  daily          01/02/2023    3:55 PM 06/25/2022    4:19 PM 06/06/2021    8:28 AM  Depression screen PHQ 2/9  Decreased Interest 0 0 0  Down, Depressed, Hopeless 0 0 0  PHQ - 2 Score 0 0 0  Altered sleeping 0  0  Tired, decreased energy 0  0  Change in appetite 0  0  Feeling bad or failure about yourself  0  0  Trouble concentrating 0  0  Moving slowly or fidgety/restless 0  0  Suicidal thoughts 0  0  PHQ-9 Score 0  0  Difficult doing work/chores Not difficult at all  Not difficult at all    Social History   Tobacco Use   Smoking status: Former   Smokeless tobacco: Never  Vaping Use   Vaping status: Never Used  Substance Use Topics   Alcohol use: Never   Drug use: No    Review of Systems Per HPI unless specifically indicated above     Objective:    BP 110/72   Pulse 60   Ht 5\' 5"  (1.651 m)   Wt 195 lb (88.5 kg)   SpO2 96%   BMI 32.45 kg/m   Wt Readings from Last 3 Encounters:  01/02/23 195 lb (88.5 kg)  06/25/22 199 lb 3.2 oz (90.4 kg)  12/23/21 195 lb (88.5 kg)    Physical Exam Vitals and  nursing note reviewed.  Constitutional:      General: He is not in acute distress.    Appearance: Normal appearance. He is well-developed. He is not diaphoretic.     Comments: Well-appearing, comfortable, cooperative  HENT:     Head: Normocephalic and atraumatic.  Eyes:     General:        Right eye: No discharge.        Left eye: No discharge.     Conjunctiva/sclera: Conjunctivae normal.  Cardiovascular:     Rate and Rhythm: Normal rate.  Pulmonary:     Effort: Pulmonary effort is normal.  Skin:    General: Skin is warm and dry.     Findings: Lesion (see photo of scalp) present. No erythema or rash.  Neurological:     Mental Status: He is alert and oriented to person, place, and time.  Psychiatric:        Mood and Affect:  Mood normal.        Behavior: Behavior normal.        Thought Content: Thought content normal.     Comments: Well groomed, good eye contact, normal speech and thoughts     Scalp    Results for orders placed or performed in visit on 01/02/23  POCT glycosylated hemoglobin (Hb A1C)  Result Value Ref Range   Hemoglobin A1C 5.8 (A) 4.0 - 5.6 %      Assessment & Plan:   Problem List Items Addressed This Visit     Elevated hemoglobin A1c - Primary    Prediabetes Improved HbA1c from 6.2 to 5.8 with lifestyle modifications, specifically reduced soda intake. -Continue current lifestyle modifications. -Check HbA1c in March 2025.      Relevant Orders   POCT glycosylated hemoglobin (Hb A1C) (Completed)   Essential hypertension    Stable blood pressure readings at home. Complication Hx CVA  - Currently on Amlodipine 10mg  daily, Hydrochlorothiazide 12.5mg  and Potassium supplement.  Given improvement, goal to limit meds. -Reduce Hydrochlorothiazide to 12.5mg  daily. -Reduce Potassium supplement to every other day.  -Check blood pressure at home, goal is less than 140/90. If readings are higher, patient can return to full dose of Hydrochlorothiazide without contacting the office.      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  Start taking Doxycycline antibiotic 100mg  twice daily for 10 days. Take with full glass of water and stay upright for at least 30 min after taking, may be seated or standing, but should NOT lay down. This is just a safety precaution, if this medicine does not go all the way down throat well it  could cause some burning discomfort to throat and esophagus.  -Apply topical antibiotic ointment. - Mupirocin AS NEEDED  -Refer to Dermatology for further evaluation and management.  Stroke History Continues to follow up with Neurologist, next appointment in a year. -Continue Aspirin for stroke prevention.  General Health Maintenance -Declined flu shot due to previous allergic reaction.        Orders Placed This Encounter  Procedures   Ambulatory referral to Dermatology    Referral Priority:  Routine    Referral Type:   Consultation    Referral Reason:   Specialty Services Required    Requested Specialty:   Dermatology    Number of Visits Requested:   1   POCT glycosylated hemoglobin (Hb A1C)     Meds ordered this encounter  Medications   mupirocin ointment (BACTROBAN) 2 %    Sig: Apply 1 Application topically 2 (two) times daily. For 2 weeks can repeat if needed    Dispense:  22 g    Refill:  2   doxycycline (VIBRA-TABS) 100 MG tablet    Sig: Take 1 tablet (100 mg total) by mouth 2 (two) times daily. For 10 days. Take with full glass of water, stay upright 30 min after taking.    Dispense:  20 tablet    Refill:  0      Follow up plan: Return in about 6 months (around 07/03/2023) for 6 month fasting lab only then 1 week later Annual Physical (later in PM).  Future labs ordered for 07/03/23   Saralyn Pilar, DO [facility] 01/02/2023, 4:13 PM

## 2023-01-02 NOTE — Assessment & Plan Note (Signed)
Stable blood pressure readings at home. Complication Hx CVA  - Currently on Amlodipine 10mg  daily, Hydrochlorothiazide 12.5mg  and Potassium supplement.  Given improvement, goal to limit meds. -Reduce Hydrochlorothiazide to 12.5mg  daily. -Reduce Potassium supplement to every other day.  -Check blood pressure at home, goal is less than 140/90. If readings are higher, patient can return to full dose of Hydrochlorothiazide without contacting the office.

## 2023-01-02 NOTE — Assessment & Plan Note (Signed)
Prediabetes Improved HbA1c from 6.2 to 5.8 with lifestyle modifications, specifically reduced soda intake. -Continue current lifestyle modifications. -Check HbA1c in March 2025.

## 2023-01-02 NOTE — Patient Instructions (Addendum)
Thank you for coming to the office today.  Recent Labs    06/19/22 0804  HGBA1C 6.2*      BP looks good Try to reduce Hydrochlorothiazide from whole tablet 25mg  down to HALF tablet 12.5mg  daily  You can also reduce the Potassium tablet, to every other day while on this lower dose.  Check BP at home  Goal < 140 / 90  If you have higher readings, okay to go back to the whole tablet.  Next time we can adjust further.  Referral to Dermatology for scalp Folliculitis infection  They will contact you with apt  Start taking Doxycycline antibiotic 100mg  twice daily for 10 days. Take with full glass of water and stay upright for at least 30 min after taking, may be seated or standing, but should NOT lay down. This is just a safety precaution, if this medicine does not go all the way down throat well it could cause some burning discomfort to throat and esophagus.  Ointment antibiotic on scalp as needed twice a day for 2 weeks.   DUE for FASTING BLOOD WORK (no food or drink after midnight before the lab appointment, only water or coffee without cream/sugar on the morning of)  SCHEDULE "Lab Only" visit in the morning at the clinic for lab draw in 6 MONTHS   - Make sure Lab Only appointment is at about 1 week before your next appointment, so that results will be available  For Lab Results, once available within 2-3 days of blood draw, you can can log in to MyChart online to view your results and a brief explanation. Also, we can discuss results at next follow-up visit.     Please schedule a Follow-up Appointment to: No follow-ups on file.  If you have any other questions or concerns, please feel free to call the office or send a message through MyChart. You may also schedule an earlier appointment if necessary.  Additionally, you may be receiving a survey about your experience at our office within a few days to 1 week by e-mail or mail. We value your feedback.  Saralyn Pilar, DO [facility], [place]

## 2023-03-12 ENCOUNTER — Other Ambulatory Visit: Payer: Self-pay | Admitting: Family Medicine

## 2023-03-12 DIAGNOSIS — I10 Essential (primary) hypertension: Secondary | ICD-10-CM

## 2023-03-12 DIAGNOSIS — I6930 Unspecified sequelae of cerebral infarction: Secondary | ICD-10-CM

## 2023-03-12 NOTE — Telephone Encounter (Signed)
Requested Prescriptions  Refused Prescriptions Disp Refills   amLODipine (NORVASC) 10 MG tablet 90 tablet 3    Sig: Take 1 tablet (10 mg total) by mouth daily.     Cardiovascular: Calcium Channel Blockers 2 Passed - 03/12/2023  5:04 PM      Passed - Last BP in normal range    BP Readings from Last 1 Encounters:  01/02/23 110/72         Passed - Last Heart Rate in normal range    Pulse Readings from Last 1 Encounters:  01/02/23 60         Passed - Valid encounter within last 6 months    Recent Outpatient Visits           2 months ago Elevated hemoglobin A1c   [place] [facility] [place], Netta Neat, DO   8 months ago Annual physical exam   [place] [facility] Smitty Cords, DO   1 year ago Essential hypertension   [place] [facility] Smitty Cords, DO   1 year ago Annual physical exam   [place] [facility] Smitty Cords, DO   1 year ago History of hemorrhagic cerebrovascular accident (CVA) with residual deficit   [place] [facility] Althea Charon, Netta Neat, DO       Future Appointments             In 4 months Althea Charon, Netta Neat, DO [facility], [place]   In 9 months Deirdre Evener, MD [place] [facility]             atorvastatin (LIPITOR) 80 MG tablet 90 tablet 3    Sig: Take 1 tablet (80 mg total) by mouth daily.     Cardiovascular:  Antilipid - Statins Failed - 03/12/2023  5:04 PM      Failed - Lipid Panel in normal range within the last 12 months    Cholesterol, Total  Date Value Ref Range Status  01/12/2017 154 100 - 199 mg/dL Final   Cholesterol  Date Value Ref Range Status  06/19/2022 110 <200 mg/dL Final   LDL Cholesterol (Calc)  Date Value Ref Range Status  06/19/2022 49 mg/dL (calc) Final    Comment:    Reference range: <100 . Desirable range <100 mg/dL for  primary prevention;   <70 mg/dL for patients with CHD or diabetic patients  with > or = 2 CHD risk factors. Marland Kitchen LDL-C is now calculated using the Martin-Hopkins  calculation, which is a validated novel method providing  better accuracy than the Friedewald equation in the  estimation of LDL-C.  Horald Pollen et al. Lenox Ahr. 1610;960(45): 2061-2068  (http://education.[employer].com/faq/FAQ164)    HDL  Date Value Ref Range Status  06/19/2022 46 > OR = 40 mg/dL Final  40/98/1191 41 >47 mg/dL Final   Triglycerides  Date Value Ref Range Status  06/19/2022 66 <150 mg/dL Final         Passed - Patient is not pregnant      Passed - Valid encounter within last 12 months    Recent Outpatient Visits           2 months ago Elevated hemoglobin A1c   [place] [facility] [place], Netta Neat, DO   8 months ago Annual physical exam   [facility] Smitty Cords, DO   1 year  ago Essential hypertension   [place] [facility] Althea Charon, Netta Neat, DO   1 year ago Annual physical exam   [place] [facility] Smitty Cords, DO   1 year ago History of hemorrhagic cerebrovascular accident (CVA) with residual deficit   [place] [facility] Althea Charon, Netta Neat, DO       Future Appointments             In 4 months Althea Charon, Netta Neat, DO [place] [facility], [place]   In 9 months Deirdre Evener, MD [facility]             hydrochlorothiazide (HYDRODIURIL) 25 MG tablet 90 tablet 3    Sig: Take 1 tablet (25 mg total) by mouth daily.     Cardiovascular: Diuretics - Thiazide Failed - 03/12/2023  5:04 PM      Failed - Cr in normal range and within 180 days    Creat  Date Value Ref Range Status  06/19/2022 0.78 0.70 - 1.30 mg/dL Final         Failed - K in normal range and within 180 days    Potassium  Date  Value Ref Range Status  06/19/2022 3.4 (L) 3.5 - 5.3 mmol/L Final         Failed - Na in normal range and within 180 days    Sodium  Date Value Ref Range Status  06/19/2022 139 135 - 146 mmol/L Final  01/12/2017 143 134 - 144 mmol/L Final         Passed - Last BP in normal range    BP Readings from Last 1 Encounters:  01/02/23 110/72         Passed - Valid encounter within last 6 months    Recent Outpatient Visits           2 months ago Elevated hemoglobin A1c   [place] [facility] Smitty Cords, DO   8 months ago Annual physical exam   [place] [facility] Smitty Cords, DO   1 year ago Essential hypertension   [place] [facility] Smitty Cords, DO   1 year ago Annual physical exam   [place] [facility] Smitty Cords, DO   1 year ago History of hemorrhagic cerebrovascular accident (CVA) with residual deficit   [place] [facility], Netta Neat, DO       Future Appointments             In 4 months Althea Charon, Netta Neat, DO [facility], [place]   In 9 months Deirdre Evener, MD [facility] [place] [facility]

## 2023-03-12 NOTE — Telephone Encounter (Signed)
Pharmacy will refill medications.

## 2023-03-12 NOTE — Telephone Encounter (Signed)
Medication Refill -  Most Recent Primary Care Visit:  Provider: Smitty Cords  Department: [facility]  Visit Type: OFFICE VISIT  Date: 01/02/2023  Medication: hydrochlorothiazide (HYDRODIURIL) 25 MG tablet amLODipine (NORVASC) 10 MG tablet atorvastatin (LIPITOR) 80 MG tablet  Has the patient contacted their pharmacy? No  Is this the correct pharmacy for this prescription? Yes  This is the patient's preferred pharmacy:  [id number] - [place], [place] - [address] [place] [address] Phone: [phone] Fax: [phone]   Has the prescription been filled recently? Yes  Is the patient out of the medication? Yes  Has the patient been seen for an appointment in the last year OR does the patient have an upcoming appointment? Yes  Can we respond through MyChart? No  Agent: Please be advised that Rx refills may take up to 3 business days. We ask that you follow-up with your pharmacy.

## 2023-05-02 ENCOUNTER — Other Ambulatory Visit: Payer: Self-pay | Admitting: Family Medicine

## 2023-05-02 DIAGNOSIS — E876 Hypokalemia: Secondary | ICD-10-CM

## 2023-05-04 NOTE — Telephone Encounter (Signed)
Requested Prescriptions  Pending Prescriptions Disp Refills   potassium chloride SA (KLOR-CON M20) 20 MEQ tablet [Pharmacy Med Name: KLOR-CON M20 TABLET] 90 tablet 0    Sig: TAKE 1 TABLET BY MOUTH EVERY DAY     Endocrinology:  Minerals - Potassium Supplementation Failed - 05/04/2023  9:27 AM      Failed - K in normal range and within 360 days    Potassium  Date Value Ref Range Status  06/19/2022 3.4 (L) 3.5 - 5.3 mmol/L Final         Passed - Cr in normal range and within 360 days    Creat  Date Value Ref Range Status  06/19/2022 0.78 0.70 - 1.30 mg/dL Final         Passed - Valid encounter within last 12 months    Recent Outpatient Visits           4 months ago Elevated hemoglobin A1c   [place] [facility] Smitty Cords, DO   10 months ago Annual physical exam   [place] [facility] Smitty Cords, DO   1 year ago Essential hypertension   [place] [facility] Smitty Cords, DO   1 year ago Annual physical exam   [place] [facility] Smit[facility] Cords, DO   2 years ago History of hemorrhagic cerebrovascular accident (CVA) with residual deficit   [place] [facility] [place], Netta Neat, DO       Future Appointments             In 2 months Althea Charon, Netta Neat, DO [place] [facility], [place]   In 7 months Deirdre Evener, MD [facility]

## 2023-06-07 ENCOUNTER — Other Ambulatory Visit: Payer: Self-pay | Admitting: Family Medicine

## 2023-06-07 DIAGNOSIS — I6930 Unspecified sequelae of cerebral infarction: Secondary | ICD-10-CM

## 2023-06-07 DIAGNOSIS — I10 Essential (primary) hypertension: Secondary | ICD-10-CM

## 2023-06-08 ENCOUNTER — Ambulatory Visit: Payer: Self-pay | Admitting: Family Medicine

## 2023-06-08 NOTE — Telephone Encounter (Signed)
  Chief Complaint: Medication question  Disposition: [] ED /[] Urgent Care (no appt availability in office) / [] Appointment(In office/virtual)/ []  [place] [facility]/ [x] Home Care/ [] Refused Recommended Disposition /[] [place] [facility]/ []  Follow-up with PCP Additional Notes: Patient's daughter, Raynelle Fanning, calls reporting patient had one elevated BP reading yesterday with a systolic of 150. She reports patient's atorvastatin was recently d/c'd by specialist two weeks ago. She reports that was the only elevated reading noted and patient is asymptomatic. This RN advised daughter to have patient monitor BP and keep a log for review. Encouraged to call back if receiving multiple readings outside of goal or if patient is experiencing any symptoms. Per protocol, home care appropriate. Care advice reviewed, caller verbalized understanding and denies further questions at this time. Advised to monitor for worsening signs and symptoms and call back with any changes. Alerting PCP for review.    Copied from CRM [id number]. Topic: Clinical - Medication Question >> Jun 08, 2023  3:09 PM Abundio Miu S wrote: Reason for CRM: Patient states that he saw  a specialist 2 weeks and was advised to stop taking atorvastatin (LIPITOR) 80 MG tablet and follow-up with his PCP. Patient states that BP was elevated at 150/?. Callback# [phone] Reason for Disposition  Health Information question, no triage required and triager able to answer question  Answer Assessment - Initial Assessment Questions 1. REASON FOR CALL or QUESTION: "What is your reason for calling today?" or "How can I best help you?" or "What question do you have that I can help answer?"     Blood pressure systolic 150 unsure of diastolic.  Protocols used: Information Only Call - No Triage-A-AH

## 2023-06-08 NOTE — Telephone Encounter (Signed)
 Spoke to daughter, keeping the appointment in April. Suggested to keep a log of BP readings for the next month and bring to the appointment.  She was agreeable to this and will call if concerned about BP readings.

## 2023-06-08 NOTE — Telephone Encounter (Signed)
 Thank you. I was aware of this. I believe actually it was not Atorvastatin that was discontinued, but I believe that they are referring to Amlodipine. This was discontinued by his dentist recently. They sent [place] a fax. I have a note to discuss this at his upcoming appointment in early April 2025.  If they want to come in sooner due to high blood pressure readings that is fine, or we can talk in April at upcoming appointment.  Saralyn Pilar, DO [facility] 06/08/2023, 4:00 PM

## 2023-06-08 NOTE — Telephone Encounter (Signed)
 Requested Prescriptions  Pending Prescriptions Disp Refills   atorvastatin (LIPITOR) 80 MG tablet [Pharmacy Med Name: ATORVASTATIN 80 MG TABLET] 90 tablet 0    Sig: TAKE 1 TABLET BY MOUTH EVERY DAY     Cardiovascular:  Antilipid - Statins Failed - 06/08/2023  4:57 PM      Failed - Lipid Panel in normal range within the last 12 months    Cholesterol, Total  Date Value Ref Range Status  01/12/2017 154 100 - 199 mg/dL Final   Cholesterol  Date Value Ref Range Status  06/19/2022 110 <200 mg/dL Final   LDL Cholesterol (Calc)  Date Value Ref Range Status  06/19/2022 49 mg/dL (calc) Final    Comment:    Reference range: <100 . Desirable range <100 mg/dL for primary prevention;   <70 mg/dL for patients with CHD or diabetic patients  with > or = 2 CHD risk factors. Marland Kitchen LDL-C is now calculated using the Martin-Hopkins  calculation, which is a validated novel method providing  better accuracy than the Friedewald equation in the  estimation of LDL-C.  Horald Pollen et al. Lenox Ahr. 1610;960(45): 2061-2068  (http://education.[facility].com/faq/FAQ164)    HDL  Date Value Ref Range Status  06/19/2022 46 > OR = 40 mg/dL Final  40/98/1191 41 >47 mg/dL Final   Triglycerides  Date Value Ref Range Status  06/19/2022 66 <150 mg/dL Final         Passed - Patient is not pregnant      Passed - Valid encounter within last 12 months    Recent Outpatient Visits           5 months ago Elevated hemoglobin A1c   [place] [facility] Smitty Cords, DO   11 months ago Annual physical exam   [place] [facility] Smit[facility] Cords, DO   1 year ago Essential hypertension   [place] [facility] Smitty Cords, DO   2 years ago Annual physical exam   [place] [facility] [place], Netta Neat, DO   2 years ago History of hemorrhagic cerebrovascular accident (CVA) with residual deficit   [facility] Smitty Cords, DO       Future Appointments             In 1 month Althea Charon, Netta Neat, DO [place] [facility], [place]   In 6 months Deirdre Evener, MD [place] [facility]             hydrochlorothiazide (HYDRODIURIL) 25 MG tablet [Pharmacy Med Name: HYDROCHLOROTHIAZIDE 25 MG TAB] 90 tablet 0    Sig: TAKE 1 TABLET (25 MG TOTAL) BY MOUTH DAILY.     Cardiovascular: Diuretics - Thiazide Failed - 06/08/2023  4:57 PM      Failed - Cr in normal range and within 180 days    Creat  Date Value Ref Range Status  06/19/2022 0.78 0.70 - 1.30 mg/dL Final         Failed - K in normal range and within 180 days    Potassium  Date Value Ref Range Status  06/19/2022 3.4 (L) 3.5 - 5.3 mmol/L Final         Failed - Na in normal range and within 180 days    Sodium  Date Value Ref Range Status  06/19/2022 139 135 - 146 mmol/L Final  01/12/2017 143 134 - 144 mmol/L Final  Passed - Last BP in normal range    BP Readings from Last 1 Encounters:  01/02/23 110/72         Passed - Valid encounter within last 6 months    Recent Outpatient Visits           5 months ago Elevated hemoglobin A1c   [place] [facility] Smitty Cords, [place]   11 months ago Annual physical exam   [place] [facility] Smit[facility], DO   1 year ago Essential hypertension   [place] [facility] Althea Charon, Netta Neat, DO   2 years ago Annual physical exam   [place] [facility] [place], Netta Neat, DO   2 years ago History of hemorrhagic cerebrovascular accident (CVA) with residual deficit   [place] [facility] Althea Charon, Netta Neat, DO       Future Appointments             In 1 month Althea Charon, Netta Neat, DO [place] [facility], [place]   In 6 months Deirdre Evener, MD  [place] [facility]             amLODipine (NORVASC) 10 MG tablet [Pharmacy Med Name: AMLODIPINE BESYLATE 10 MG TAB] 90 tablet 0    Sig: TAKE 1 TABLET BY MOUTH EVERY DAY     Cardiovascular: Calcium Channel Blockers 2 Passed - 06/08/2023  4:57 PM      Passed - Last BP in normal range    BP Readings from Last 1 Encounters:  01/02/23 110/72         Passed - Last Heart Rate in normal range    Pulse Readings from Last 1 Encounters:  01/02/23 60         Passed - Valid encounter within last 6 months    Recent Outpatient Visits           5 months ago Elevated hemoglobin A1c   [place] [facility] Smitty Cords, DO   11 months ago Annual physical exam   [place] [facility] Smitty Cords, DO   1 year ago Essential hypertension   [place] [facility] Smitty Cords, DO   2 years ago Annual physical exam   [place] [facility] [place], Netta Neat, DO   2 years ago History of hemorrhagic cerebrovascular accident (CVA) with residual deficit   [place] [facility] [place], Netta Neat, DO       Future Appointments             In 1 month Althea Charon, Netta Neat, DO [place] [facility], [place]   In 6 months Deirdre Evener, MD [facility]

## 2023-07-03 ENCOUNTER — Other Ambulatory Visit: Payer: Self-pay

## 2023-07-03 DIAGNOSIS — I10 Essential (primary) hypertension: Secondary | ICD-10-CM

## 2023-07-03 DIAGNOSIS — Z0000 Encounter for general adult medical examination without abnormal findings: Secondary | ICD-10-CM

## 2023-07-03 DIAGNOSIS — E7800 Pure hypercholesterolemia, unspecified: Secondary | ICD-10-CM

## 2023-07-03 DIAGNOSIS — R7309 Other abnormal glucose: Secondary | ICD-10-CM

## 2023-07-03 DIAGNOSIS — Z125 Encounter for screening for malignant neoplasm of prostate: Secondary | ICD-10-CM

## 2023-07-04 LAB — COMPLETE METABOLIC PANEL WITHOUT GFR
AG Ratio: 1.3 (calc) (ref 1.0–2.5)
ALT: 27 U/L (ref 9–46)
AST: 20 U/L (ref 10–35)
Albumin: 4.3 g/dL (ref 3.6–5.1)
Alkaline phosphatase (APISO): 64 U/L (ref 35–144)
BUN: 15 mg/dL (ref 7–25)
CO2: 29 mmol/L (ref 20–32)
Calcium: 8.8 mg/dL (ref 8.6–10.3)
Chloride: 103 mmol/L (ref 98–110)
Creat: 0.79 mg/dL (ref 0.70–1.30)
Globulin: 3.3 g/dL (ref 1.9–3.7)
Glucose, Bld: 109 mg/dL (ref 65–139)
Potassium: 3.7 mmol/L (ref 3.5–5.3)
Sodium: 140 mmol/L (ref 135–146)
Total Bilirubin: 0.7 mg/dL (ref 0.2–1.2)
Total Protein: 7.6 g/dL (ref 6.1–8.1)

## 2023-07-04 LAB — CBC WITH DIFFERENTIAL/PLATELET
Absolute Lymphocytes: 1967 {cells}/uL (ref 850–3900)
Absolute Monocytes: 420 {cells}/uL (ref 200–950)
Basophils Absolute: 63 {cells}/uL (ref 0–200)
Basophils Relative: 0.9 %
Eosinophils Absolute: 98 {cells}/uL (ref 15–500)
Eosinophils Relative: 1.4 %
HCT: 40.8 % (ref 38.5–50.0)
Hemoglobin: 13.5 g/dL (ref 13.2–17.1)
MCH: 29 pg (ref 27.0–33.0)
MCHC: 33.1 g/dL (ref 32.0–36.0)
MCV: 87.7 fL (ref 80.0–100.0)
MPV: 11.8 fL (ref 7.5–12.5)
Monocytes Relative: 6 %
Neutro Abs: 4452 {cells}/uL (ref 1500–7800)
Neutrophils Relative %: 63.6 %
Platelets: 182 10*3/uL (ref 140–400)
RBC: 4.65 10*6/uL (ref 4.20–5.80)
RDW: 13.2 % (ref 11.0–15.0)
Total Lymphocyte: 28.1 %
WBC: 7 10*3/uL (ref 3.8–10.8)

## 2023-07-04 LAB — LIPID PANEL
Cholesterol: 106 mg/dL (ref <200)
HDL: 44 mg/dL (ref >=40)
LDL Cholesterol (Calc): 47 mg/dL
Non-HDL Cholesterol (Calc): 62 mg/dL (ref <130)
Total CHOL/HDL Ratio: 2.4 (calc) (ref <5.0)
Triglycerides: 69 mg/dL (ref <150)

## 2023-07-04 LAB — TSH: TSH: 1.5 m[IU]/L (ref 0.40–4.50)

## 2023-07-04 LAB — PSA: PSA: 1.63 ng/mL (ref <=4.00)

## 2023-07-04 LAB — HEMOGLOBIN A1C
Hgb A1c MFr Bld: 5.9 %{Hb} — ABNORMAL HIGH (ref <5.7)
Mean Plasma Glucose: 123 mg/dL
eAG (mmol/L): 6.8 mmol/L

## 2023-07-10 ENCOUNTER — Encounter: Payer: Self-pay | Admitting: Family Medicine

## 2023-07-14 ENCOUNTER — Encounter: Payer: Self-pay | Admitting: Family Medicine

## 2023-07-14 ENCOUNTER — Ambulatory Visit (INDEPENDENT_AMBULATORY_CARE_PROVIDER_SITE_OTHER): Payer: Self-pay | Admitting: Family Medicine

## 2023-07-14 VITALS — BP 145/74 | HR 59 | Ht 65.0 in | Wt 194.1 lb

## 2023-07-14 DIAGNOSIS — E66811 Obesity, class 1: Secondary | ICD-10-CM

## 2023-07-14 DIAGNOSIS — K061 Gingival enlargement: Secondary | ICD-10-CM

## 2023-07-14 DIAGNOSIS — I10 Essential (primary) hypertension: Secondary | ICD-10-CM

## 2023-07-14 DIAGNOSIS — Z0000 Encounter for general adult medical examination without abnormal findings: Secondary | ICD-10-CM

## 2023-07-14 DIAGNOSIS — R7309 Other abnormal glucose: Secondary | ICD-10-CM | POA: Diagnosis not present

## 2023-07-14 DIAGNOSIS — E7800 Pure hypercholesterolemia, unspecified: Secondary | ICD-10-CM | POA: Diagnosis not present

## 2023-07-14 DIAGNOSIS — I6930 Unspecified sequelae of cerebral infarction: Secondary | ICD-10-CM

## 2023-07-14 MED ORDER — ATORVASTATIN CALCIUM 80 MG PO TABS: 80.0000 mg | ORAL_TABLET | Freq: Every day | ORAL | 3 refills | Status: AC

## 2023-07-14 MED ORDER — VALSARTAN 80 MG PO TABS: 80.0000 mg | ORAL_TABLET | Freq: Every day | ORAL | 1 refills | Status: DC

## 2023-07-14 MED ORDER — HYDROCHLOROTHIAZIDE 25 MG PO TABS: 25.0000 mg | ORAL_TABLET | Freq: Every day | ORAL | 3 refills | Status: AC

## 2023-07-14 NOTE — Patient Instructions (Addendum)
 Thank you for coming to the office today.  Recommend Shingrix vaccine at the pharmacy 2 doses 2-6 months apart.  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  For now, finish taking every other day on the Potassium through the weekend and stop on Monday.  We will repeat lab in 4-6 weeks.  Refill Atorvastatin, hydrochlorothiazide   You have been referred for a Coronary Calcium Score Cardiac CT Scan. This is a screening test for patients aged 67-50+ with cardiovascular risk factors or who are healthy but would be interested in Cardiovascular Screening for heart disease. Even if there is a family history of heart disease, this imaging can be useful. Typically it can be done every 5+ years or at a different timeline we agree on  The scan will look at the chest and mainly focus on the heart and identify early signs of calcium build up or blockages within the heart arteries. It is not 100% accurate for identifying blockages or heart disease, but it is useful to help us  predict who may have some early changes or be at risk in the future for a heart attack or cardiovascular problem.  The results are reviewed by a Cardiologist and they will document the results. It should become available on MyChart. Typically the results are divided into percentiles based on other patients of the same demographic and age. So it will compare your risk to others similar to you. If you have a higher score >99 or higher percentile >75%tile, it is recommended to consider Statin  cholesterol therapy and or referral to Cardiologist. I will try to help explain your results and if we have questions we can contact the Cardiologist.  You will be contacted for scheduling. Usually it is done at any imaging facility through [facility], [facility] or [facility].  The cost is $99 flat fee total and it does not go through insurance, so no authorization is required.  ---------  Also schedule 6 month follow-up with me for blood pressure and blood sugar A1c check, we can also consider drawing the potassium   Please schedule a Follow-up Appointment to: Return in about 6 months (around 01/13/2024) for 6 month PreDM A1c.  If you have any other questions or concerns, please feel free to call the office or send a message through MyChart. You may also schedule an earlier appointment if necessary.  Additionally, you may be receiving a survey about your experience at our office within a few days to 1 week by e-mail or mail. We value your feedback.  Domingo Friend, DO [facility], [place]

## 2023-07-14 NOTE — Progress Notes (Signed)
 Subjective:    Patient ID: Ian Moore, male    DOB: 07-09-1965, 58 y.o.   MRN: [id number]  Ian Moore is a 58 y.o. male presenting on 07/14/2023 for Annual Exam and Hypertension   HPI  Discussed the use of AI scribe software for clinical note transcription with the patient, who gave verbal consent to proceed.  History of Present Illness   Ian Moore is a 58 year old male with hypertension who presents for an annual physical exam.      CHRONIC HTN: He has a history of hypertension and has been experiencing issues with blood pressure management. Previously, he was on amlodipine, which was discontinued on June 08, 2023 by Dr Donell Beers oral surgery, due to gingival hyperplasia. Since stopping amlodipine, his systolic blood pressure has been elevated, particularly in the mornings, reaching the 180s, and decreasing to the 150s in the afternoons.  He has not been keeping a log of his blood pressure as requested. Last lab K 3.7 Current Meds - HCTZ 25mg  daily   + K supplement every other day - OFF Amlodipine 10mg  Reports good compliance, took meds today. Tolerating well, w/o complaints. Lifestyle: - Diet: reduced sodium intake - Exercise: increased exercise Denies CP, dyspnea, HA, edema, dizziness / lightheadedness  Elevated A1c / Pre Diabetes Recent lab 5.9 last result 5.8, previously 6-6.2 Improved diet Reduced sodas Meds: None Lifestyle: Denies hypoglycemia, polyuria, visual changes, numbness or tingling.   HYPERLIPIDEMIA: - Reports no concerns. Last lipid panel 06/2023, with LDL 47 Goal is < 55 with history of CVA - Currently taking Atorvastatin 80mg  nightly, tolerating well without side effects or myalgias   History of CVA W residual deficit Left upper extremity reduced sensation Followed by [facility] Dr Malvin Johns On continue ASA 81mg  daily  History Folliculitis He has also been experiencing a persistent rash on his scalp for the past 15 years, which  appears to be a bacterial infection. The rash is not currently painful, but it becomes tender when it flares up. Over-the-counter medications have only provided temporary relief.  Health Maintenance:  PSA 1.63 (06/2023)  Next Colonoscopy 2030     07/14/2023    3:36 PM 01/02/2023    3:55 PM 06/25/2022    4:19 PM  Depression screen PHQ 2/9  Decreased Interest 0 0 0  Down, Depressed, Hopeless 0 0 0  PHQ - 2 Score 0 0 0  Altered sleeping 0 0   Tired, decreased energy 0 0   Change in appetite 0 0   Feeling bad or failure about yourself  0 0   Trouble concentrating 0 0   Moving slowly or fidgety/restless 0 0   Suicidal thoughts 0 0   PHQ-9 Score 0 0   Difficult doing work/chores  Not difficult at all        07/14/2023    3:36 PM 01/02/2023    3:55 PM 06/06/2021    8:28 AM 04/24/2021    8:08 AM  GAD 7 : Generalized Anxiety Score  Nervous, Anxious, on Edge 0 0 0 0  Control/stop worrying 0 0 0 0  Worry too much - different things 0 0 0 0  Trouble relaxing 0 0 0 0  Restless 0 0 0 0  Easily annoyed or irritable 0 0 0 0  Afraid - awful might happen 0 0 0 0  Total GAD 7 Score 0 0 0 0  Anxiety Difficulty   Not difficult at all Not difficult at all  Past Medical History:  Diagnosis Date   Hypertension    Stroke [facility])    Past Surgical History:  Procedure Laterality Date   COLONOSCOPY WITH PROPOFOL N/A 06/27/2021   Procedure: COLONOSCOPY WITH PROPOFOL;  Surgeon: Wyline Mood, MD;  Location: [facility] ENDOSCOPY;  Service: Gastroenterology;  Laterality: N/A;  SPANISH INTERPRETER   NO PAST SURGERIES     Social History   Socioeconomic History   Marital status: Married    Spouse name: Not on file   Number of children: Not on file   Years of education: Not on file   Highest education level: Not on file  Occupational History   Not on file  Tobacco Use   Smoking status: Former   Smokeless tobacco: Never  Vaping Use   Vaping status: Never Used  Substance and Sexual Activity    Alcohol use: Never   Drug use: No   Sexual activity: Not on file  Other Topics Concern   Not on file  Social History Narrative   Not on file   Social Drivers of Health   Financial Resource Strain: Not on file  Food Insecurity: Not on file  Transportation Needs: Not on file  Physical Activity: Not on file  Stress: Not on file  Social Connections: Not on file  Intimate Partner Violence: Not on file   History reviewed. No pertinent family history. Current Outpatient Medications on File Prior to Visit  Medication Sig   aspirin EC 81 MG tablet Take 81 mg by mouth daily. Swallow whole.   mupirocin ointment (BACTROBAN) 2 % Apply 1 Application topically 2 (two) times daily. For 2 weeks can repeat if needed   potassium chloride SA (KLOR-CON M20) 20 MEQ tablet TAKE 1 TABLET BY MOUTH EVERY DAY   No current facility-administered medications on file prior to visit.    Review of Systems  Constitutional:  Negative for activity change, appetite change, chills, diaphoresis, fatigue and fever.  HENT:  Negative for congestion and hearing loss.   Eyes:  Negative for visual disturbance.  Respiratory:  Negative for cough, chest tightness, shortness of breath and wheezing.   Cardiovascular:  Negative for chest pain, palpitations and leg swelling.  Gastrointestinal:  Negative for abdominal pain, constipation, diarrhea, nausea and vomiting.  Genitourinary:  Negative for dysuria, frequency and hematuria.  Musculoskeletal:  Negative for arthralgias and neck pain.  Skin:  Negative for rash.  Neurological:  Negative for dizziness, weakness, light-headedness, numbness and headaches.  Hematological:  Negative for adenopathy.  Psychiatric/Behavioral:  Negative for behavioral problems, dysphoric mood and sleep disturbance.    Per HPI unless specifically indicated above     Objective:    BP (!) 145/74 (BP Location: Left Arm, Cuff Size: Normal)   Pulse (!) 59   Ht 5\' 5"  (1.651 m)   Wt 194 lb 2 oz  (88.1 kg)   SpO2 96%   BMI 32.30 kg/m   Wt Readings from Last 3 Encounters:  07/14/23 194 lb 2 oz (88.1 kg)  01/02/23 195 lb (88.5 kg)  06/25/22 199 lb 3.2 oz (90.4 kg)    Physical Exam Vitals and nursing note reviewed.  Constitutional:      General: He is not in acute distress.    Appearance: He is well-developed. He is not diaphoretic.     Comments: Well-appearing, comfortable, cooperative  HENT:     Head: Normocephalic and atraumatic.  Eyes:     General:        Right eye: No discharge.  Left eye: No discharge.     Conjunctiva/sclera: Conjunctivae normal.     Pupils: Pupils are equal, round, and reactive to light.  Neck:     Thyroid: No thyromegaly.     Vascular: No carotid bruit.  Cardiovascular:     Rate and Rhythm: Normal rate and regular rhythm.     Pulses: Normal pulses.     Heart sounds: Normal heart sounds. No murmur heard. Pulmonary:     Effort: Pulmonary effort is normal. No respiratory distress.     Breath sounds: Normal breath sounds. No wheezing or rales.  Abdominal:     General: Bowel sounds are normal. There is no distension.     Palpations: Abdomen is soft. There is no mass.     Tenderness: There is no abdominal tenderness.  Musculoskeletal:        General: No tenderness. Normal range of motion.     Cervical back: Normal range of motion and neck supple.     Right lower leg: No edema.     Left lower leg: No edema.     Comments: Upper / Lower Extremities: - Normal muscle tone, strength bilateral upper extremities 5/5, lower extremities 5/5  Lymphadenopathy:     Cervical: No cervical adenopathy.  Skin:    General: Skin is warm and dry.     Findings: No erythema or rash.  Neurological:     Mental Status: He is alert and oriented to person, place, and time.     Comments: Distal sensation intact to light touch all extremities  Psychiatric:        Mood and Affect: Mood normal.        Behavior: Behavior normal.        Thought Content: Thought  content normal.     Comments: Well groomed, good eye contact, normal speech and thoughts     Results for orders placed or performed in visit on 07/03/23  TSH   Collection Time: 07/03/23  8:07 AM  Result Value Ref Range   TSH 1.50 0.40 - 4.50 mIU/L  PSA   Collection Time: 07/03/23  8:07 AM  Result Value Ref Range   PSA 1.63 < OR = 4.00 ng/mL  CBC with Differential/Platelet   Collection Time: 07/03/23  8:07 AM  Result Value Ref Range   WBC 7.0 3.8 - 10.8 Thousand/uL   RBC 4.65 4.20 - 5.80 Million/uL   Hemoglobin 13.5 13.2 - 17.1 g/dL   HCT 16.1 09.6 - 04.5 %   MCV 87.7 80.0 - 100.0 fL   MCH 29.0 27.0 - 33.0 pg   MCHC 33.1 32.0 - 36.0 g/dL   RDW 40.9 81.1 - 91.4 %   Platelets 182 140 - 400 Thousand/uL   MPV 11.8 7.5 - 12.5 fL   Neutro Abs 4,452 1,500 - 7,800 cells/uL   Absolute Lymphocytes 1,967 850 - 3,900 cells/uL   Absolute Monocytes 420 200 - 950 cells/uL   Eosinophils Absolute 98 15 - 500 cells/uL   Basophils Absolute 63 0 - 200 cells/uL   Neutrophils Relative % 63.6 %   Total Lymphocyte 28.1 %   Monocytes Relative 6.0 %   Eosinophils Relative 1.4 %   Basophils Relative 0.9 %  COMPLETE METABOLIC PANEL WITH GFR   Collection Time: 07/03/23  8:07 AM  Result Value Ref Range   Glucose, Bld 109 65 - 139 mg/dL   BUN 15 7 - 25 mg/dL   Creat 7.82 9.56 - 2.13 mg/dL   BUN/Creatinine Ratio SEE  NOTE: 6 - 22 (calc)   Sodium 140 135 - 146 mmol/L   Potassium 3.7 3.5 - 5.3 mmol/L   Chloride 103 98 - 110 mmol/L   CO2 29 20 - 32 mmol/L   Calcium 8.8 8.6 - 10.3 mg/dL   Total Protein 7.6 6.1 - 8.1 g/dL   Albumin 4.3 3.6 - 5.1 g/dL   Globulin 3.3 1.9 - 3.7 g/dL (calc)   AG Ratio 1.3 1.0 - 2.5 (calc)   Total Bilirubin 0.7 0.2 - 1.2 mg/dL   Alkaline phosphatase (APISO) 64 35 - 144 U/L   AST 20 10 - 35 U/L   ALT 27 9 - 46 U/L  Lipid panel   Collection Time: 07/03/23  8:07 AM  Result Value Ref Range   Cholesterol 106 <200 mg/dL   HDL 44 > OR = 40 mg/dL   Triglycerides 69 <191  mg/dL   LDL Cholesterol (Calc) 47 mg/dL (calc)   Total CHOL/HDL Ratio 2.4 <5.0 (calc)   Non-HDL Cholesterol (Calc) 62 <478 mg/dL (calc)  Hemoglobin G9F   Collection Time: 07/03/23  8:07 AM  Result Value Ref Range   Hgb A1c MFr Bld 5.9 (H) <5.7 % of total Hgb   Mean Plasma Glucose 123 mg/dL   eAG (mmol/L) 6.8 mmol/L      Assessment & Plan:   Problem List Items Addressed This Visit     Elevated hemoglobin A1c   Elevated LDL cholesterol level   Relevant Orders   CT CARDIAC SCORING (SELF PAY ONLY)   Essential hypertension   Relevant Medications   valsartan (DIOVAN) 80 MG tablet   atorvastatin (LIPITOR) 80 MG tablet   hydrochlorothiazide (HYDRODIURIL) 25 MG tablet   Other Relevant Orders   CT CARDIAC SCORING (SELF PAY ONLY)   History of hemorrhagic cerebrovascular accident (CVA) with residual deficit   Relevant Medications   atorvastatin (LIPITOR) 80 MG tablet   Other Relevant Orders   CT CARDIAC SCORING (SELF PAY ONLY)   Other Visit Diagnoses       Annual physical exam    -  Primary     Obesity (BMI 30.0-34.9)            Updated Health Maintenance information Reviewed recent lab results with patient Encouraged improvement to lifestyle with diet and exercise Goal of weight loss  Hypertension Blood pressure elevated due to discontinuation of amlodipine 10mg  (due to gingival hyperplasia). Already on hydrochlorothiazide + K Valsartan chosen for renal protection and potassium stabilization.  - Remain off Amlodipine - Initiate valsartan 80 mg daily. - Continue hydrochlorothiazide 25mg  daily - Continue K 20 mEq alternating every other day through weekend and stop on Monday  - Monitor blood pressure at home, alternating between morning and afternoon readings. - Reassess blood pressure and potassium levels in 4-6 weeks with BMET - If BP Controlled and K normal, we can keep hydrochlorothiazide 25 + valsartan 80, and remain off K - If BP elevated still, we can dose  increase Valsartan 80 to 160mg  - If BP controlled and K is low, we can reduce hydrochlorothiazide 25 to 12.5 and inc Valsartan 80 to 160 to avoid K supplement - BMET on 08/12/23  Pre-diabetes A1c improved to 5.9%. Goal to maintain A1c in 5% range to prevent diabetes progression. - Continue dietary modifications to maintain A1c in the 5% range. - Monitor A1c levels regularly. No additional medication  Hyperlipidemia At goal < 55 History of CVA LDL cholesterol controlled at 47 mg/dL, below target for cardiovascular event  patients. Continue Atorvastatin 80mg  - on ASA 81mg   General Health Maintenance Eligible for shingles vaccine due to chickenpox history. Colonoscopy up to date. - Recommend receiving the shingles vaccine at a pharmacy for cost-effectiveness. - Schedule next colonoscopy for 2030.  Cardiovascular Assessment CT scan recommended for early cardiovascular issue detection due to stroke and hypertension history. - Order CT scan of the chest for cardiovascular assessment.  Follow-up Monitor blood pressure and potassium levels. - Schedule follow-up visit in 6 months for blood pressure and sugar check. - Use My Chart for communication and updates on blood pressure and potassium levels.        Orders Placed This Encounter  Procedures   CT CARDIAC SCORING (SELF PAY ONLY)    Standing Status:   Future    Expiration Date:   07/13/2024    Preferred imaging location?:   [facility]    Meds ordered this encounter  Medications   valsartan (DIOVAN) 80 MG tablet    Sig: Take 1 tablet (80 mg total) by mouth daily.    Dispense:  30 tablet    Refill:  1   atorvastatin (LIPITOR) 80 MG tablet    Sig: Take 1 tablet (80 mg total) by mouth daily.    Dispense:  90 tablet    Refill:  3   hydrochlorothiazide (HYDRODIURIL) 25 MG tablet    Sig: Take 1 tablet (25 mg total) by mouth daily.    Dispense:  90 tablet    Refill:  3     Follow up plan: Return in about 6 months  (around 01/13/2024) for 6 month PreDM A1c.  4-6 weeks 08/12/23 BMET for K  Domingo Friend, DO [facility] 07/14/2023, 3:48 PM

## 2023-07-29 ENCOUNTER — Encounter: Payer: Self-pay | Admitting: Family Medicine

## 2023-07-29 ENCOUNTER — Ambulatory Visit
Admission: RE | Admit: 2023-07-29 | Discharge: 2023-07-29 | Disposition: A | Discharge status: Home / Self Care | Payer: Self-pay | Source: Ambulatory Visit | Attending: Family Medicine | Admitting: Family Medicine

## 2023-07-29 DIAGNOSIS — E7800 Pure hypercholesterolemia, unspecified: Secondary | ICD-10-CM | POA: Insufficient documentation

## 2023-07-29 DIAGNOSIS — I10 Essential (primary) hypertension: Secondary | ICD-10-CM | POA: Insufficient documentation

## 2023-07-29 DIAGNOSIS — I6930 Unspecified sequelae of cerebral infarction: Secondary | ICD-10-CM | POA: Insufficient documentation

## 2023-07-30 ENCOUNTER — Other Ambulatory Visit: Payer: Self-pay | Admitting: Family Medicine

## 2023-07-30 DIAGNOSIS — I6930 Unspecified sequelae of cerebral infarction: Secondary | ICD-10-CM

## 2023-07-30 DIAGNOSIS — E7800 Pure hypercholesterolemia, unspecified: Secondary | ICD-10-CM

## 2023-07-30 DIAGNOSIS — R931 Abnormal findings on diagnostic imaging of heart and coronary circulation: Secondary | ICD-10-CM

## 2023-08-02 ENCOUNTER — Other Ambulatory Visit: Payer: Self-pay | Admitting: Family Medicine

## 2023-08-02 DIAGNOSIS — E876 Hypokalemia: Secondary | ICD-10-CM

## 2023-08-04 NOTE — Telephone Encounter (Signed)
 Requested Prescriptions  Pending Prescriptions Disp Refills   potassium chloride  SA (KLOR-CON  M) 20 MEQ tablet [Pharmacy Med Name: POTASSIUM CL ER 20 MEQ TAB MCR] 90 tablet 3    Sig: TAKE 1 TABLET BY MOUTH EVERY DAY     Endocrinology:  Minerals - Potassium Supplementation Passed - 08/04/2023 12:10 PM      Passed - K in normal range and within 360 days    Potassium  Date Value Ref Range Status  07/03/2023 3.7 3.5 - 5.3 mmol/L Final         Passed - Cr in normal range and within 360 days    Creat  Date Value Ref Range Status  07/03/2023 0.79 0.70 - 1.30 mg/dL Final         Passed - Valid encounter within last 12 months    Recent Outpatient Visits           3 weeks ago Annual physical exam   [place] Raina Bunting, DO       Future Appointments             In 4 months Elta Halter, MD [facility]

## 2023-08-12 ENCOUNTER — Other Ambulatory Visit

## 2023-08-12 DIAGNOSIS — I10 Essential (primary) hypertension: Secondary | ICD-10-CM

## 2023-08-17 LAB — BASIC METABOLIC PANEL WITHOUT GFR

## 2023-09-08 ENCOUNTER — Other Ambulatory Visit: Payer: Self-pay | Admitting: Family Medicine

## 2023-09-08 DIAGNOSIS — I10 Essential (primary) hypertension: Secondary | ICD-10-CM

## 2023-09-08 NOTE — Telephone Encounter (Signed)
 Requested by interface surescripts.  Requested Prescriptions  Pending Prescriptions Disp Refills   valsartan  (DIOVAN ) 80 MG tablet [Pharmacy Med Name: VALSARTAN  80 MG TABLET] 30 tablet 0    Sig: TAKE 1 TABLET BY MOUTH EVERY DAY     Cardiovascular:  Angiotensin Receptor Blockers Failed - 09/08/2023  3:53 PM      Failed - Last BP in normal range    BP Readings from Last 1 Encounters:  07/14/23 (!) 145/74         Passed - Cr in normal range and within 180 days    Creat  Date Value Ref Range Status  07/03/2023 0.79 0.70 - 1.30 mg/dL Final         Passed - K in normal range and within 180 days    Potassium  Date Value Ref Range Status  07/03/2023 3.7 3.5 - 5.3 mmol/L Final         Passed - Patient is not pregnant      Passed - Valid encounter within last 6 months    Recent Outpatient Visits           1 month ago Annual physical exam   [place] [facility] Ian Bunting, DO       Future Appointments             In 3 months Ian Halter, MD [facility] [place] [facility]

## 2023-09-11 ENCOUNTER — Other Ambulatory Visit: Payer: Self-pay

## 2023-09-11 DIAGNOSIS — I10 Essential (primary) hypertension: Secondary | ICD-10-CM

## 2023-09-11 DIAGNOSIS — E7800 Pure hypercholesterolemia, unspecified: Secondary | ICD-10-CM

## 2023-09-11 DIAGNOSIS — Z125 Encounter for screening for malignant neoplasm of prostate: Secondary | ICD-10-CM

## 2023-09-11 DIAGNOSIS — Z0000 Encounter for general adult medical examination without abnormal findings: Secondary | ICD-10-CM

## 2023-09-11 DIAGNOSIS — R7309 Other abnormal glucose: Secondary | ICD-10-CM

## 2023-09-12 ENCOUNTER — Other Ambulatory Visit: Payer: Self-pay | Admitting: Family Medicine

## 2023-09-12 DIAGNOSIS — I10 Essential (primary) hypertension: Secondary | ICD-10-CM

## 2023-09-12 LAB — COMPREHENSIVE METABOLIC PANEL WITH GFR
AG Ratio: 1.3 (calc) (ref 1.0–2.5)
ALT: 25 U/L (ref 9–46)
AST: 18 U/L (ref 10–35)
Albumin: 4.2 g/dL (ref 3.6–5.1)
Alkaline phosphatase (APISO): 58 U/L (ref 35–144)
BUN: 17 mg/dL (ref 7–25)
CO2: 29 mmol/L (ref 20–32)
Calcium: 8.5 mg/dL — ABNORMAL LOW (ref 8.6–10.3)
Chloride: 101 mmol/L (ref 98–110)
Creat: 0.85 mg/dL (ref 0.70–1.30)
Globulin: 3.3 g/dL (ref 1.9–3.7)
Glucose, Bld: 103 mg/dL — ABNORMAL HIGH (ref 65–99)
Potassium: 3.9 mmol/L (ref 3.5–5.3)
Sodium: 138 mmol/L (ref 135–146)
Total Bilirubin: 0.8 mg/dL (ref 0.2–1.2)
Total Protein: 7.5 g/dL (ref 6.1–8.1)
eGFR: 100 mL/min/{1.73_m2} (ref >=60)

## 2023-09-14 ENCOUNTER — Ambulatory Visit: Payer: Self-pay | Admitting: Family Medicine

## 2023-09-14 NOTE — Telephone Encounter (Signed)
 Requested Prescriptions  Refused Prescriptions Disp Refills   amLODipine  (NORVASC ) 10 MG tablet [Pharmacy Med Name: AMLODIPINE  BESYLATE 10 MG TAB] 90 tablet 0    Sig: TAKE 1 TABLET BY MOUTH EVERY DAY     Cardiovascular: Calcium  Channel Blockers 2 Failed - 09/14/2023  5:24 PM      Failed - Last BP in normal range    BP Readings from Last 1 Encounters:  07/14/23 (!) 145/74         Passed - Last Heart Rate in normal range    Pulse Readings from Last 1 Encounters:  07/14/23 (!) 59         Passed - Valid encounter within last 6 months    Recent Outpatient Visits           2 months ago Annual physical exam   [place] [facility] [place], Kayleen Party, DO       Future Appointments             In 3 months Elta Halter, MD [facility] [place] [facility]

## 2023-10-03 ENCOUNTER — Other Ambulatory Visit: Payer: Self-pay | Admitting: Family Medicine

## 2023-10-03 DIAGNOSIS — I10 Essential (primary) hypertension: Secondary | ICD-10-CM

## 2023-10-06 NOTE — Telephone Encounter (Signed)
 Requested Prescriptions  Pending Prescriptions Disp Refills   valsartan  (DIOVAN ) 80 MG tablet [Pharmacy Med Name: VALSARTAN  80 MG TABLET] 90 tablet 0    Sig: TAKE 1 TABLET BY MOUTH EVERY DAY     Cardiovascular:  Angiotensin Receptor Blockers Failed - 10/06/2023 12:16 PM      Failed - Last BP in normal range    BP Readings from Last 1 Encounters:  07/14/23 (!) 145/74         Passed - Cr in normal range and within 180 days    Creat  Date Value Ref Range Status  09/11/2023 0.85 0.70 - 1.30 mg/dL Final         Passed - K in normal range and within 180 days    Potassium  Date Value Ref Range Status  09/11/2023 3.9 3.5 - 5.3 mmol/L Final         Passed - Patient is not pregnant      Passed - Valid encounter within last 6 months    Recent Outpatient Visits           2 months ago Annual physical exam   [facility] [place] Marsa PARAS, DO       Future Appointments             In 2 months Hester Alm BROCKS, MD [facility]

## 2023-12-23 ENCOUNTER — Encounter: Payer: Self-pay | Admitting: Dermatology

## 2023-12-23 ENCOUNTER — Ambulatory Visit: Payer: BLUE CROSS/BLUE SHIELD | Admitting: Dermatology

## 2023-12-23 DIAGNOSIS — Z7189 Other specified counseling: Secondary | ICD-10-CM

## 2023-12-23 DIAGNOSIS — Z79899 Other long term (current) drug therapy: Secondary | ICD-10-CM

## 2023-12-23 DIAGNOSIS — L669 Cicatricial alopecia, unspecified: Secondary | ICD-10-CM

## 2023-12-23 DIAGNOSIS — L03818 Cellulitis of other sites: Secondary | ICD-10-CM

## 2023-12-23 DIAGNOSIS — L658 Other specified nonscarring hair loss: Secondary | ICD-10-CM

## 2023-12-23 MED ORDER — DOXYCYCLINE MONOHYDRATE 100 MG PO CAPS: 100.0000 mg | ORAL_CAPSULE | Freq: Two times a day (BID) | ORAL | 0 refills | Status: AC

## 2023-12-23 NOTE — Patient Instructions (Addendum)
 Start Doxycycline  100 mg twice daily with food for 14 days.  Doxycycline  should be taken with food to prevent nausea. Do not lay down for 30 minutes after taking. Be cautious with sun exposure and use good sun protection while on this medication. Pregnant women should not take this medication.    Avoid touching area. Wash hands after touching.     Due to recent changes in healthcare laws, you may see results of your pathology and/or laboratory studies on MyChart before the doctors have had a chance to review them. We understand that in some cases there may be results that are confusing or concerning to you. Please understand that not all results are received at the same time and often the doctors may need to interpret multiple results in order to provide you with the best plan of care or course of treatment. Therefore, we ask that you please give us  2 business days to thoroughly review all your results before contacting the office for clarification. Should we see a critical lab result, you will be contacted sooner.   If You Need Anything After Your Visit  If you have any questions or concerns for your doctor, please call our main line at [phone] and press option 4 to reach your doctor's medical assistant. If no one answers, please leave a voicemail as directed and we will return your call as soon as possible. Messages left after 4 pm will be answered the following business day.   You may also send us  a message via MyChart. We typically respond to MyChart messages within 1-2 business days.  For prescription refills, please ask your pharmacy to contact our office. Our fax number is [phone].  If you have an urgent issue when the clinic is closed that cannot wait until the next business day, you can page your doctor at the number below.    Please note that while we do our best to be available for urgent issues outside of office hours, we are not available 24/7.   If you have an urgent  issue and are unable to reach us , you may choose to seek medical care at your doctor's office, retail clinic, urgent care center, or emergency room.  If you have a medical emergency, please immediately call 911 or go to the emergency department.  Pager Numbers  - Dr. Hester: [phone]  - Dr. Jackquline: [phone]  - Dr. Claudene: [phone]   - Dr. Raymund: [phone]  In the event of inclement weather, please call our main line at [phone] for an update on the status of any delays or closures.  Dermatology Medication Tips: Please keep the boxes that topical medications come in in order to help keep track of the instructions about where and how to use these. Pharmacies typically print the medication instructions only on the boxes and not directly on the medication tubes.   If your medication is too expensive, please contact our office at [phone] option 4 or send us  a message through MyChart.   We are unable to tell what your co-pay for medications will be in advance as this is different depending on your insurance coverage. However, we may be able to find a substitute medication at lower cost or fill out paperwork to get insurance to cover a needed medication.   If a prior authorization is required to get your medication covered by your insurance company, please allow us  1-2 business days to complete this process.  Drug prices often vary depending on where the  prescription is filled and some pharmacies may offer cheaper prices.  The website www.goodrx.com contains coupons for medications through different pharmacies. The prices here do not account for what the cost may be with help from insurance (it may be cheaper with your insurance), but the website can give you the price if you did not use any insurance.  - You can print the associated coupon and take it with your prescription to the pharmacy.  - You may also stop by our office during regular business hours and pick up a  GoodRx coupon card.  - If you need your prescription sent electronically to a different pharmacy, notify our office through [facility] or by phone at [phone] option 4.     Si Usted Necesita Algo Despus de Su Visita  Tambin puede enviarnos un mensaje a travs de Clinical cytogeneticist. Por lo general respondemos a los mensajes de MyChart en el transcurso de 1 a 2 das hbiles.  Para renovar recetas, por favor pida a su farmacia que se ponga en contacto con nuestra oficina. Randi lakes de fax es [place] [phone].  Si tiene un asunto urgente cuando la clnica est cerrada y que no puede esperar hasta el siguiente da hbil, puede llamar/localizar a su doctor(a) al nmero que aparece a continuacin.   Por favor, tenga en cuenta que aunque hacemos todo lo posible para estar disponibles para asuntos urgentes fuera del horario de [place], no estamos disponibles las 24 horas del da, los 7 [address]  [address] de la [place].   Si tiene un problema urgente y no puede comunicarse con nosotros, puede optar por buscar atencin mdica  en el consultorio de su doctor(a), en una clnica privada, en un centro de atencin urgente o en una sala de emergencias.  Si tiene Engineer, drilling, por favor llame inmediatamente al 911 o vaya a la sala de emergencias.  Nmeros de bper  - Dr. Hester: [phone]  - Dra. Jackquline: [phone]  - Dr. Claudene: [phone]  - Dra. Kitts: [phone]  En caso de inclemencias del [place], por favor llame a nuestra lnea principal al [phone] para una actualizacin sobre el estado de cualquier retraso o cierre.  Consejos para la medicacin en dermatologa: Por favor, guarde las cajas en las que vienen los medicamentos de uso tpico para ayudarle a seguir las instrucciones sobre dnde y cmo usarlos. Las farmacias generalmente imprimen las instrucciones del medicamento slo en las cajas y no directamente en los tubos del [place].   Si su medicamento es muy caro, por  favor, pngase en contacto con landry rieger llamando al [phone] y presione la opcin 4 o envenos un mensaje a travs de Clinical cytogeneticist.   No podemos decirle cul ser su copago por los medicamentos por adelantado ya que esto es diferente dependiendo de la cobertura de su seguro. Sin embargo, es posible que podamos encontrar un medicamento sustituto a Audiological scientist un formulario para que el seguro cubra el medicamento que se considera necesario.   Si se requiere una autorizacin previa para que su compaa de seguros malta su medicamento, por favor permtanos de 1 a 2 das hbiles para completar este proceso.  Los precios de los medicamentos varan con frecuencia dependiendo del Environmental consultant de dnde se surte la receta y alguna farmacias pueden ofrecer precios ms baratos.  El sitio web www.goodrx.com tiene cupones para medicamentos de Health and safety inspector. Los precios aqu no tienen en cuenta lo que podra costar con la ayuda del seguro (puede ser ms barato con su  seguro), pero el sitio web puede darle el precio si no Visual merchandiser.  - Puede imprimir el cupn correspondiente y llevarlo con su receta a la farmacia.  - Tambin puede pasar por nuestra oficina durante el horario de atencin regular y Education officer, museum una tarjeta de cupones de GoodRx.  - Si necesita que su receta se enve electrnicamente a una farmacia diferente, informe a nuestra oficina a travs de MyChart de [facility] o por telfono llamando al [phone] y presione la opcin 4.

## 2023-12-23 NOTE — Progress Notes (Addendum)
   New Patient Visit   Subjective  Ian Moore is a 58 y.o. male who presents for the following: bumps on scalp. Dur: couple of years. PCP prescribed Mupirocin  ointment, helped a little but bumps keep coming back. Painful, itching.   The following portions of the chart were reviewed this encounter and updated as appropriate: medications, allergies, medical history  Daughter, [name], is with patient and contributes to history.  Review of Systems:  No other skin or systemic complaints except as noted in HPI or Assessment and Plan.  Objective  Well appearing patient in no apparent distress; mood and affect are within normal limits.  A focused examination was performed of the following areas: Scalp, face  Relevant exam findings are noted in the Assessment and Plan.  Scalp - Cellulitis with hair loss: Tinea vs Bacterial vs Hidradenitis Areas of hair loss with loss of follicular ostia. Large plaque of scarring.  No cervical lymphadenopathy.        Assessment & Plan  SCARRING ALOPECIA Scalp - Cellulitis with hair loss: Tinea vs Bacterial vs Hidradenitis Start Doxycycline  100 mg twice daily with food for 14 days.  Doxycycline  should be taken with food to prevent nausea. Do not lay down for 30 minutes after taking. Be cautious with sun exposure and use good sun protection while on this medication. Pregnant women should not take this medication.   Derm-ID Molecular Diagnostic test performed today.  Discussed with patient their insurance will be billed.  Advised the patient they may get a bill for a portion that's not covered by their insurance.  Should the patient have any issues with their remaining responsibility they will not be sent to collections but KRISTINE will work with them internally on any remaining balance.    doxycycline  (MONODOX ) 100 MG capsule - Scalp - Cellulitis with hair loss: Tinea vs Bacterial vs Hidradenitis Take 1 capsule (100 mg total) by mouth 2 (two)  times daily for 14 days. Take with food and drink CELLULITIS OF OTHER SPECIFIED SITE   COUNSELING AND COORDINATION OF CARE   MEDICATION MANAGEMENT    Return in about 4 months (around 04/23/2024).  I, Jill Parcell, CMA, am acting as scribe for Alm Rhyme, MD.   Documentation: I have reviewed the above documentation for accuracy and completeness, and I agree with the above.  Alm Rhyme, MD

## 2023-12-28 ENCOUNTER — Telehealth: Payer: Self-pay

## 2023-12-28 NOTE — Telephone Encounter (Signed)
 Please see molecular pathogen study scanned into media. Please advise.

## 2023-12-30 ENCOUNTER — Encounter: Payer: Self-pay | Admitting: Cardiology

## 2023-12-30 ENCOUNTER — Ambulatory Visit: Attending: Cardiology | Admitting: Cardiology

## 2023-12-30 VITALS — BP 140/82 | HR 56 | Ht 64.0 in | Wt 199.2 lb

## 2023-12-30 DIAGNOSIS — I2510 Atherosclerotic heart disease of native coronary artery without angina pectoris: Secondary | ICD-10-CM | POA: Diagnosis not present

## 2023-12-30 DIAGNOSIS — I10 Essential (primary) hypertension: Secondary | ICD-10-CM

## 2023-12-30 MED ORDER — VALSARTAN 160 MG PO TABS: 160.0000 mg | ORAL_TABLET | Freq: Every day | ORAL | 3 refills | Status: DC

## 2023-12-30 NOTE — Progress Notes (Signed)
 Cardiology Office Note:    Date:  12/30/2023   ID:  Ian Moore, DOB 1965/11/06, MRN [id number]  PCP:  Edman Marsa PARAS, DO   [facility] Providers Cardiologist:  None     Referring MD: Edman Marsa *   Chief Complaint  Patient presents with   Establish Care    New pt has been doing well with no complaints of chest pain, chest pressure or SOB, medciation reviewed verbally with patient   Ian Moore is a 58 y.o. male who is being seen today for the evaluation of CAD at the request of Edman Marsa *.   History of Present Illness:    Ian Moore is a 58 y.o. male with a hx of CAD (LAD, LCx, RCA calcifications, CT cardiac score 4/25, calcium  score 363, 89 percentile), hypertension, hemorrhagic stroke 2013 with some left-sided weakness, presenting due to coronary artery calcifications.  Patient had a coronary calcium  scan 06/2023 with score of 363, 89 percentile for age.    Patient denies chest pain or shortness of breath.  Compliant medications as prescribed.  Endorses eating salty foods, BP at home usually ranges in the 130s to 140s systolic.  Denies any personal or family history of heart disease or heart attacks.  Echo 1/23 EF 60 to 65%  Past Medical History:  Diagnosis Date   Hypertension    Stroke [facility])     Past Surgical History:  Procedure Laterality Date   COLONOSCOPY WITH PROPOFOL  N/A 06/27/2021   Procedure: COLONOSCOPY WITH PROPOFOL ;  Surgeon: Therisa Bi, MD;  Location: [facility] ENDOSCOPY;  Service: Gastroenterology;  Laterality: N/A;  SPANISH INTERPRETER   NO PAST SURGERIES      Current Medications: Current Meds  Medication Sig   aspirin  EC 81 MG tablet Take 81 mg by mouth daily. Swallow whole.   atorvastatin  (LIPITOR) 80 MG tablet Take 1 tablet (80 mg total) by mouth daily.   doxycycline  (MONODOX ) 100 MG capsule Take 1 capsule (100 mg total) by mouth 2 (two) times daily for 14 days. Take with food and drink    hydrochlorothiazide  (HYDRODIURIL ) 25 MG tablet Take 1 tablet (25 mg total) by mouth daily.   potassium chloride  SA (KLOR-CON  M) 20 MEQ tablet TAKE 1 TABLET BY MOUTH EVERY DAY   [DISCONTINUED] valsartan  (DIOVAN ) 80 MG tablet TAKE 1 TABLET BY MOUTH EVERY DAY     Allergies:   Amlodipine , Influenza a (h1n1) monoval vac, and Penicillins   Social History   Socioeconomic History   Marital status: Married    Spouse name: Not on file   Number of children: Not on file   Years of education: Not on file   Highest education level: Not on file  Occupational History   Not on file  Tobacco Use   Smoking status: Former   Smokeless tobacco: Never  Vaping Use   Vaping status: Never Used  Substance and Sexual Activity   Alcohol use: Never   Drug use: No   Sexual activity: Not on file  Other Topics Concern   Not on file  Social History Narrative   Not on file   Social Drivers of Health   Financial Resource Strain: Not on file  Food Insecurity: Not on file  Transportation Needs: Not on file  Physical Activity: Not on file  Stress: Not on file  Social Connections: Not on file     Family History: The patient's family history includes Heart attack in his mother; Hypertension in his sister.  ROS:  Please see the history of present illness.     All other systems reviewed and are negative.  EKGs/Labs/Other Studies Reviewed:    The following studies were reviewed today:  EKG Interpretation Date/Time:  Wednesday December 30 2023 08:32:43 EDT Ventricular Rate:  56 PR Interval:  178 QRS Duration:  110 QT Interval:  412 QTC Calculation: 397 R Axis:   -48  Text Interpretation: Sinus bradycardia Left anterior fascicular block Nonspecific ST abnormality Confirmed by Darliss Rogue ([id number]) on 12/30/2023 8:47:45 AM    Recent Labs: 07/03/2023: Hemoglobin 13.5; Platelets 182; TSH 1.50 09/11/2023: ALT 25; BUN 17; Creat 0.85; Potassium 3.9; Sodium 138  Recent Lipid Panel    Component Value  Date/Time   CHOL 106 07/03/2023 0807   CHOL 154 01/12/2017 0944   TRIG 69 07/03/2023 0807   HDL 44 07/03/2023 0807   HDL 41 01/12/2017 0944   CHOLHDL 2.4 07/03/2023 0807   VLDL 28 04/11/2021 1041   LDLCALC 47 07/03/2023 0807     Risk Assessment/Calculations:           Physical Exam:    VS:  BP (!) 140/82 (BP Location: Left Arm, Patient Position: Sitting, Cuff Size: Normal)   Pulse (!) 56   Ht 5' 4 (1.626 m)   Wt 199 lb 3.2 oz (90.4 kg)   SpO2 99%   BMI 34.19 kg/m     Wt Readings from Last 3 Encounters:  12/30/23 199 lb 3.2 oz (90.4 kg)  07/14/23 194 lb 2 oz (88.1 kg)  01/02/23 195 lb (88.5 kg)     GEN:  Well nourished, well developed in no acute distress HEENT: Normal NECK: No JVD; No carotid bruits CARDIAC: RRR, no murmurs, rubs, gallops RESPIRATORY:  Clear to auscultation without rales, wheezing or rhonchi  ABDOMEN: Soft, non-tender, non-distended MUSCULOSKELETAL:  No edema; No deformity  SKIN: Warm and dry NEUROLOGIC:  Alert and oriented x 3 PSYCHIATRIC:  Normal affect   ASSESSMENT:    1. Coronary artery disease involving native coronary artery of native heart, unspecified whether angina present   2. Primary hypertension   3. Essential hypertension    PLAN:    In order of problems listed above:  CAD/coronary calcifications in LAD, LCx, RCA.  Calcium  score 363/89 percentile.  Denies chest pain.  Echo 2023 EF 60 to 65%.  LDL at goal.  Continue aspirin  81 mg daily, Lipitor 80 mg daily. Hypertension, BP elevated.  Increase valsartan  to 160 mg daily.  Follow-up 6 months.      Medication Adjustments/Labs and Tests Ordered: Current medicines are reviewed at length with the patient today.  Concerns regarding medicines are outlined above.  Orders Placed This Encounter  Procedures   EKG 12-Lead   Meds ordered this encounter  Medications   valsartan  (DIOVAN ) 160 MG tablet    Sig: Take 1 tablet (160 mg total) by mouth daily.    Dispense:  30 tablet     Refill:  3    Patient Instructions  Medication Instructions:  - INCREASE diovan  to 160 mg daily   *If you need a refill on your cardiac medications before your next appointment, please call your pharmacy*  Lab Work: No labs ordered today  If you have labs (blood work) drawn today and your tests are completely normal, you will receive your results only by: MyChart Message (if you have MyChart) OR A paper copy in the mail If you have any lab test that is abnormal or we need to change your treatment,  we will call you to review the results.  Testing/Procedures: No test ordered today   Follow-Up: At [facility], you and your health needs are our priority.  As part of our continuing mission to provide you with exceptional heart care, our providers are all part of one team.  This team includes your primary Cardiologist (physician) and Advanced Practice Providers or APPs (Physician Assistants and Nurse Practitioners) who all work together to provide you with the care you need, when you need it.  Your next appointment:   6 month(s)  Provider:   You may see Dr. Darliss or one of the following Advanced Practice Providers on your designated Care Team:   Lonni Meager, NP Lesley Maffucci, PA-C Bernardino Bring, PA-C Cadence [place], PA-C Tylene Lunch, NP Barnie Hila, NP    We recommend signing up for the patient portal called MyChart.  Sign up information is provided on this After Visit Summary.  MyChart is used to connect with patients for Virtual Visits (Telemedicine).  Patients are able to view lab/test results, encounter notes, upcoming appointments, etc.  Non-urgent messages can be sent to your provider as well.   To learn more about what you can do with MyChart, go to [email or web address].              Signed, Redell Darliss, MD  12/30/2023 9:29 AM    [facility]

## 2023-12-30 NOTE — Patient Instructions (Signed)
 Medication Instructions:  - INCREASE diovan  to 160 mg daily   *If you need a refill on your cardiac medications before your next appointment, please call your pharmacy*  Lab Work: No labs ordered today  If you have labs (blood work) drawn today and your tests are completely normal, you will receive your results only by: MyChart Message (if you have MyChart) OR A paper copy in the mail If you have any lab test that is abnormal or we need to change your treatment, we will call you to review the results.  Testing/Procedures: No test ordered today   Follow-Up: At [facility], you and your health needs are our priority.  As part of our continuing mission to provide you with exceptional heart care, our providers are all part of one team.  This team includes your primary Cardiologist (physician) and Advanced Practice Providers or APPs (Physician Assistants and Nurse Practitioners) who all work together to provide you with the care you need, when you need it.  Your next appointment:   6 month(s)  Provider:   You may see Dr. Darliss or one of the following Advanced Practice Providers on your designated Care Team:   Lonni Meager, NP Lesley Maffucci, PA-C Bernardino Bring, PA-C Cadence [place], PA-C Tylene Lunch, NP Barnie Hila, NP    We recommend signing up for the patient portal called MyChart.  Sign up information is provided on this After Visit Summary.  MyChart is used to connect with patients for Virtual Visits (Telemedicine).  Patients are able to view lab/test results, encounter notes, upcoming appointments, etc.  Non-urgent messages can be sent to your provider as well.   To learn more about what you can do with MyChart, go to [email or web address].

## 2024-01-04 MED ORDER — CLINDAMYCIN PHOSPHATE 1 % EX SOLN: CUTANEOUS | 3 refills | Status: AC

## 2024-01-04 NOTE — Telephone Encounter (Signed)
 Discussed results with daughter [name] and medication sent to pharmacy.

## 2024-04-01 ENCOUNTER — Other Ambulatory Visit: Payer: Self-pay | Admitting: Cardiology

## 2024-04-01 DIAGNOSIS — I10 Essential (primary) hypertension: Secondary | ICD-10-CM

## 2024-04-27 ENCOUNTER — Ambulatory Visit: Admitting: Dermatology

## 2024-05-24 ENCOUNTER — Ambulatory Visit: Admitting: Dermatology
